# Patient Record
Sex: Male | Born: 1990 | Race: Black or African American | Hispanic: No | Marital: Single | Smoking: Current every day smoker
Health system: Southern US, Community
[De-identification: ages and names within clinical notes are randomized; demographics above are authoritative.]

## PROBLEM LIST (undated history)

## (undated) DIAGNOSIS — M109 Gout, unspecified: Secondary | ICD-10-CM

## (undated) DIAGNOSIS — E119 Type 2 diabetes mellitus without complications: Secondary | ICD-10-CM

## (undated) DIAGNOSIS — I10 Essential (primary) hypertension: Secondary | ICD-10-CM

## (undated) HISTORY — PX: TOTAL HIP ARTHROPLASTY: SHX124

---

## 2013-04-22 ENCOUNTER — Encounter (HOSPITAL_COMMUNITY): Payer: Self-pay | Admitting: Emergency Medicine

## 2013-04-22 ENCOUNTER — Emergency Department (HOSPITAL_COMMUNITY)
Admission: EM | Admit: 2013-04-22 | Discharge: 2013-04-22 | Disposition: A | Discharge status: Home / Self Care | Payer: Medicaid - Out of State | Attending: Emergency Medicine | Admitting: Emergency Medicine

## 2013-04-22 ENCOUNTER — Emergency Department (HOSPITAL_COMMUNITY): Payer: Medicaid - Out of State

## 2013-04-22 DIAGNOSIS — Z8639 Personal history of other endocrine, nutritional and metabolic disease: Secondary | ICD-10-CM | POA: Insufficient documentation

## 2013-04-22 DIAGNOSIS — Z791 Long term (current) use of non-steroidal anti-inflammatories (NSAID): Secondary | ICD-10-CM | POA: Insufficient documentation

## 2013-04-22 DIAGNOSIS — Y929 Unspecified place or not applicable: Secondary | ICD-10-CM | POA: Insufficient documentation

## 2013-04-22 DIAGNOSIS — S8990XA Unspecified injury of unspecified lower leg, initial encounter: Secondary | ICD-10-CM | POA: Insufficient documentation

## 2013-04-22 DIAGNOSIS — Y9301 Activity, walking, marching and hiking: Secondary | ICD-10-CM | POA: Insufficient documentation

## 2013-04-22 DIAGNOSIS — X500XXA Overexertion from strenuous movement or load, initial encounter: Secondary | ICD-10-CM | POA: Insufficient documentation

## 2013-04-22 DIAGNOSIS — Z862 Personal history of diseases of the blood and blood-forming organs and certain disorders involving the immune mechanism: Secondary | ICD-10-CM | POA: Insufficient documentation

## 2013-04-22 DIAGNOSIS — M722 Plantar fascial fibromatosis: Secondary | ICD-10-CM | POA: Insufficient documentation

## 2013-04-22 DIAGNOSIS — F17200 Nicotine dependence, unspecified, uncomplicated: Secondary | ICD-10-CM | POA: Insufficient documentation

## 2013-04-22 HISTORY — DX: Gout, unspecified: M10.9

## 2013-04-22 MED ORDER — MELOXICAM 15 MG PO TABS: 15.0000 mg | ORAL_TABLET | Freq: Every day | ORAL | Status: DC

## 2013-04-22 NOTE — ED Notes (Addendum)
Onset 04-20-13 walking outside and twisted right foot.

## 2013-04-22 NOTE — ED Provider Notes (Signed)
CSN: [id number]     Arrival date & time 04/22/13  1226 History  This chart was scribed for Renne Crigler, PA working with Shelda Jakes, MD by Quintella Reichert, ED Scribe. This patient was seen in room TR06C/TR06C and the patient's care was started at 12:56 PM.   Chief Complaint  Patient presents with  . Foot Pain    The history is provided by the patient. No language interpreter was used.    HPI Comments: Kevin Mccarty is a 22 y.o. male who presents to the Emergency Department complaining of a right foot injury.  Pt states that 2 nights ago he stepped down too hard onto a step.  Since then he has had constant moderate pain to the bottom of the foot.  He has been able to walk but with limited weight-bearing.  Pain is worse at night when lying down.  He has not taken any pain medication pta.  Pt notes that he has pins in both of his hips because "my bones weren't developed right." Patient does report ongoing foot pain to a lesser extent that is worse with first steps in morning with improvement with activity.    Past Medical History  Diagnosis Date  . Gout     History reviewed. No pertinent past surgical history.  History reviewed. No pertinent family history.   History  Substance Use Topics  . Smoking status: Current Every Day Smoker -- 0.50 packs/day for 5 years    Types: Cigarettes  . Smokeless tobacco: Not on file  . Alcohol Use: No     Review of Systems  Constitutional: Negative for activity change.  Musculoskeletal: Positive for myalgias. Negative for arthralgias, back pain, gait problem, joint swelling and neck pain.  Skin: Negative for wound.  Neurological: Negative for weakness and numbness.     Allergies  Tramadol  Home Medications   Current Outpatient Rx  Name  Route  Sig  Dispense  Refill  . meloxicam (MOBIC) 15 MG tablet   Oral   Take 1 tablet (15 mg total) by mouth daily.   14 tablet   0     BP 132/78  Pulse 80  Temp(Src) 97 F (36.1 C)  (Oral)  Resp 19  Wt 395 lb (179.171 kg)  SpO2 95%  Physical Exam  Nursing note and vitals reviewed. Constitutional: He appears well-developed and well-nourished. No distress.  HENT:  Head: Normocephalic and atraumatic.  Eyes: Conjunctivae and EOM are normal.  Neck: Normal range of motion. Neck supple. No tracheal deviation present.  Cardiovascular: Normal rate and normal pulses.   Pulmonary/Chest: Effort normal. No respiratory distress.  Musculoskeletal: Normal range of motion. He exhibits tenderness. He exhibits no edema.  Generally tender over sole of right foot  Neurological: He is alert. No sensory deficit.  Motor, sensation, and vascular distal to the injury is fully intact.   Skin: Skin is warm and dry.  Psychiatric: He has a normal mood and affect. His behavior is normal.    ED Course  Procedures (including critical care time)  DIAGNOSTIC STUDIES: Oxygen Saturation is 95% on room air, adequate by my interpretation.    COORDINATION OF CARE: 1:00 PM-Discussed treatment plan which includes x-ray with pt at bedside and pt agreed to plan.    Labs Review Labs Reviewed - No data to display  Imaging Review Dg Foot Complete Right  04/22/2013   CLINICAL DATA:  Pain.  EXAM: RIGHT FOOT COMPLETE - 3+ VIEW  COMPARISON:  None.  FINDINGS:  There is no evidence of fracture or dislocation. There is no evidence of arthropathy or other focal bone abnormality. Soft tissues are unremarkable.  IMPRESSION: Negative.   Electronically Signed   By: Maisie Fus  Register   On: 04/22/2013 13:37    EKG Interpretation   None      Vital signs reviewed and are as follows: Filed Vitals:   04/22/13 1235  BP: 132/78  Pulse: 80  Temp: 97 F (36.1 C)  Resp: 19   Patient informed of x-ray results. Will give prescription NSAID for pain and inflammation. Encouraged and counseled patient on gentle stretching of sole of foot. Podiatry referral given if not improved.   MDM   1. Plantar fasciitis of  right foot    X-ray negative. Patient with the pain on the sole of his foot. I suspect that he has an element of plantar fasciitis to the injury and also from being overweight. Lower extremity is neurovascularly intact.    I personally performed the services described in this documentation, which was scribed in my presence. The recorded information has been reviewed and is accurate.    Renne Crigler, PA-C 04/22/13 [phone]

## 2013-04-26 NOTE — ED Provider Notes (Signed)
Medical screening examination/treatment/procedure(s) were performed by non-physician practitioner and as supervising physician I was immediately available for consultation/collaboration.  EKG Interpretation   None         Preciosa Bundrick W. Kutler Vanvranken, MD 04/26/13 1326 

## 2019-08-25 ENCOUNTER — Encounter (HOSPITAL_COMMUNITY): Payer: Self-pay | Admitting: Emergency Medicine

## 2019-08-25 ENCOUNTER — Other Ambulatory Visit: Payer: Self-pay

## 2019-08-25 ENCOUNTER — Ambulatory Visit (HOSPITAL_COMMUNITY)
Admission: EM | Admit: 2019-08-25 | Discharge: 2019-08-25 | Disposition: A | Discharge status: Home / Self Care | Payer: Medicare Other | Attending: Family Medicine | Admitting: Family Medicine

## 2019-08-25 DIAGNOSIS — I10 Essential (primary) hypertension: Secondary | ICD-10-CM

## 2019-08-25 DIAGNOSIS — B370 Candidal stomatitis: Secondary | ICD-10-CM

## 2019-08-25 DIAGNOSIS — E109 Type 1 diabetes mellitus without complications: Secondary | ICD-10-CM

## 2019-08-25 HISTORY — DX: Essential (primary) hypertension: I10

## 2019-08-25 HISTORY — DX: Type 2 diabetes mellitus without complications: E11.9

## 2019-08-25 LAB — CBG MONITORING, ED: Glucose-Capillary: 363 mg/dL — ABNORMAL HIGH (ref 70–99)

## 2019-08-25 LAB — GLUCOSE, CAPILLARY: Glucose-Capillary: 363 mg/dL — ABNORMAL HIGH (ref 70–99)

## 2019-08-25 MED ORDER — BLOOD GLUCOSE MONITOR KIT: PACK | 0 refills | Status: AC

## 2019-08-25 MED ORDER — FLUCONAZOLE 150 MG PO TABS: 150.0000 mg | ORAL_TABLET | Freq: Every morning | ORAL | 0 refills | Status: DC

## 2019-08-25 NOTE — ED Triage Notes (Signed)
Pt sts he ran out of insulin last week and just picked it up 2 days ago to restart; pt sts noticed a yeast infection and dry mouth last week; pt sts CBG was over 400 at home

## 2019-08-25 NOTE — ED Provider Notes (Signed)
[facility]    CSN: [id number] Arrival date & time: 08/25/19  1205      History   Chief Complaint Chief Complaint  Patient presents with  . Hyperglycemia    HPI Kevin Mccarty is a 29 y.o. male.   Initial [facility] visit with this 29 yo man complaining about possible hyperglycemia.  He is back on his insulin but he has a yeast infection on his penis and in his mouth.  He has mild blurry vision in his left eye but is getting better.  He does have polyuria still.  Pt sts he ran out of insulin last week and just picked it up 2 days ago to restart; pt sts noticed a yeast infection and dry mouth last week; pt sts CBG was over 400 at home        Past Medical History:  Diagnosis Date  . Diabetes mellitus without complication (HCC)   . Gout   . Hypertension     There are no problems to display for this patient.   History reviewed. No pertinent surgical history.     Home Medications    Prior to Admission medications   Medication Sig Start Date End Date Taking? Authorizing Provider  glipiZIDE (GLUCOTROL) 5 MG tablet Take 5 mg by mouth daily before breakfast.   Yes [provider]  insulin glargine (LANTUS) 100 UNIT/ML injection Inject 70 Units into the skin at bedtime.   Yes [provider]  losartan-hydrochlorothiazide (HYZAAR) 100-12.5 MG tablet Take 1 tablet by mouth daily.   Yes [provider]  fluconazole (DIFLUCAN) 150 MG tablet Take 1 tablet (150 mg total) by mouth every morning. 08/25/19   Elvina Sidle, MD  meloxicam (MOBIC) 15 MG tablet Take 1 tablet (15 mg total) by mouth daily. 04/22/13   Renne Crigler, PA-C    Family History History reviewed. No pertinent family history.  Social History Social History   Tobacco Use  . Smoking status: Current Every Day Smoker    Packs/day: 0.50    Years: 5.00    Pack years: 2.50    Types: Cigarettes  Substance Use Topics  . Alcohol use: No  . Drug use: No     Allergies     Tramadol   Review of Systems Review of Systems  Eyes: Positive for visual disturbance.  Genitourinary: Positive for frequency and penile pain.  All other systems reviewed and are negative.    Physical Exam Triage Vital Signs ED Triage Vitals  Enc Vitals Group     BP      Pulse      Resp      Temp      Temp src      SpO2      Weight      Height      Head Circumference      Peak Flow      Pain Score      Pain Loc      Pain Edu?      Excl. in GC?    No data found.  Updated Vital Signs BP (!) 138/113 (BP Location: Left Wrist)   Pulse 81   Temp 98.2 F (36.8 C) (Oral)   Resp 18   Wt (!) 186.9 kg   SpO2 97%    Physical Exam Vitals and nursing note reviewed.  Constitutional:      Appearance: Normal appearance. He is obese.  HENT:     Mouth/Throat:     Pharynx:  Oropharyngeal exudate present.  Cardiovascular:     Pulses: Normal pulses.  Pulmonary:     Effort: Pulmonary effort is normal.  Musculoskeletal:        General: Normal range of motion.     Cervical back: Normal range of motion and neck supple.  Skin:    General: Skin is dry.  Neurological:     General: No focal deficit present.     Mental Status: He is alert and oriented to person, place, and time.  Psychiatric:        Mood and Affect: Mood normal.      UC Treatments / Results  Labs (all labs ordered are listed, but only abnormal results are displayed) Labs Reviewed  GLUCOSE, CAPILLARY - Abnormal; Notable for the following components:      Result Value   Glucose-Capillary 363 (*)    All other components within normal limits  CBG MONITORING, ED - Abnormal; Notable for the following components:   Glucose-Capillary 363 (*)    All other components within normal limits  CBG MONITORING, ED    EKG   Radiology No results found.  Procedures Procedures (including critical care time)  Medications Ordered in UC Medications - No data to display  Initial Impression / Assessment and Plan  / UC Course  I have reviewed the triage vital signs and the nursing notes.  Pertinent labs & imaging results that were available during my care of the patient were reviewed by me and considered in my medical decision making (see chart for details).    Final Clinical Impressions(s) / UC Diagnoses   Final diagnoses:  Insulin dependent diabetes mellitus type IA [facility])  Essential hypertension  Oral thrush   Discharge Instructions   None    ED Prescriptions    Medication Sig Dispense Auth. Provider   fluconazole (DIFLUCAN) 150 MG tablet Take 1 tablet (150 mg total) by mouth every morning. 7 tablet Robyn Haber, MD     I have reviewed the PDMP during this encounter.   Robyn Haber, MD 08/25/19 1243

## 2019-08-25 NOTE — ED Notes (Addendum)
Attempted to weigh patient upon arrival.  Weight exceeded scales weight limit.  Patient states last known weight is 412 lbs.

## 2019-08-28 ENCOUNTER — Ambulatory Visit (INDEPENDENT_AMBULATORY_CARE_PROVIDER_SITE_OTHER)
Admission: EM | Admit: 2019-08-28 | Discharge: 2019-08-28 | Disposition: A | Discharge status: Home / Self Care | Payer: Medicare Other | Source: Home / Self Care | Attending: Internal Medicine | Admitting: Internal Medicine

## 2019-08-28 ENCOUNTER — Emergency Department (HOSPITAL_COMMUNITY): Payer: Medicare Other

## 2019-08-28 ENCOUNTER — Emergency Department (HOSPITAL_COMMUNITY)
Admission: EM | Admit: 2019-08-28 | Discharge: 2019-08-28 | Disposition: A | Discharge status: Home / Self Care | Payer: Medicare Other | Attending: Internal Medicine | Admitting: Internal Medicine

## 2019-08-28 ENCOUNTER — Encounter (HOSPITAL_COMMUNITY): Payer: Self-pay

## 2019-08-28 ENCOUNTER — Other Ambulatory Visit: Payer: Self-pay

## 2019-08-28 ENCOUNTER — Encounter (HOSPITAL_COMMUNITY): Payer: Self-pay | Admitting: Radiology

## 2019-08-28 DIAGNOSIS — F17210 Nicotine dependence, cigarettes, uncomplicated: Secondary | ICD-10-CM | POA: Diagnosis not present

## 2019-08-28 DIAGNOSIS — N492 Inflammatory disorders of scrotum: Secondary | ICD-10-CM

## 2019-08-28 DIAGNOSIS — N5082 Scrotal pain: Secondary | ICD-10-CM | POA: Diagnosis present

## 2019-08-28 DIAGNOSIS — E119 Type 2 diabetes mellitus without complications: Secondary | ICD-10-CM | POA: Insufficient documentation

## 2019-08-28 DIAGNOSIS — I10 Essential (primary) hypertension: Secondary | ICD-10-CM | POA: Insufficient documentation

## 2019-08-28 DIAGNOSIS — Z794 Long term (current) use of insulin: Secondary | ICD-10-CM | POA: Diagnosis not present

## 2019-08-28 LAB — BASIC METABOLIC PANEL
Anion gap: 11 (ref 5–15)
BUN: 5 mg/dL — ABNORMAL LOW (ref 6–20)
CO2: 26 mmol/L (ref 22–32)
Calcium: 9.6 mg/dL (ref 8.9–10.3)
Chloride: 100 mmol/L (ref 98–111)
Creatinine, Ser: 0.7 mg/dL (ref 0.61–1.24)
GFR calc Af Amer: >60 mL/min (ref >60)
GFR calc non Af Amer: >60 mL/min (ref >60)
Glucose, Bld: 254 mg/dL — ABNORMAL HIGH (ref 70–99)
Potassium: 3.9 mmol/L (ref 3.5–5.1)
Sodium: 137 mmol/L (ref 135–145)

## 2019-08-28 LAB — CBC WITH DIFFERENTIAL/PLATELET
Abs Immature Granulocytes: 0.02 10*3/uL (ref 0.00–0.07)
Basophils Absolute: 0 10*3/uL (ref 0.0–0.1)
Basophils Relative: 0 %
Eosinophils Absolute: 0.2 10*3/uL (ref 0.0–0.5)
Eosinophils Relative: 3 %
HCT: 44 % (ref 39.0–52.0)
Hemoglobin: 14.8 g/dL (ref 13.0–17.0)
Immature Granulocytes: 0 %
Lymphocytes Relative: 31 %
Lymphs Abs: 2.5 10*3/uL (ref 0.7–4.0)
MCH: 27.4 pg (ref 26.0–34.0)
MCHC: 33.6 g/dL (ref 30.0–36.0)
MCV: 81.3 fL (ref 80.0–100.0)
Monocytes Absolute: 0.7 10*3/uL (ref 0.1–1.0)
Monocytes Relative: 8 %
Neutro Abs: 4.8 10*3/uL (ref 1.7–7.7)
Neutrophils Relative %: 58 %
Platelets: 204 10*3/uL (ref 150–400)
RBC: 5.41 MIL/uL (ref 4.22–5.81)
RDW: 13.2 % (ref 11.5–15.5)
WBC: 8.3 10*3/uL (ref 4.0–10.5)
nRBC: 0 % (ref 0.0–0.2)

## 2019-08-28 LAB — CBG MONITORING, ED: Glucose-Capillary: 245 mg/dL — ABNORMAL HIGH (ref 70–99)

## 2019-08-28 MED ORDER — SODIUM CHLORIDE 0.9 % IV BOLUS
500.0000 mL | Freq: Once | INTRAVENOUS | Status: AC
  Administered 2019-08-28: 17:00:00 500 mL via INTRAVENOUS

## 2019-08-28 MED ORDER — MORPHINE SULFATE (PF) 2 MG/ML IV SOLN
2.0000 mg | Freq: Once | INTRAVENOUS | Status: AC
  Administered 2019-08-28: 17:00:00 2 mg via INTRAVENOUS
  Filled 2019-08-28: qty 1

## 2019-08-28 MED ORDER — VANCOMYCIN HCL 2000 MG/400ML IV SOLN
2000.0000 mg | Freq: Three times a day (TID) | INTRAVENOUS | Status: DC
  Administered 2019-08-28: 19:00:00 2000 mg via INTRAVENOUS
  Filled 2019-08-28 (×2): qty 400

## 2019-08-28 MED ORDER — IOHEXOL 300 MG/ML  SOLN
125.0000 mL | Freq: Once | INTRAMUSCULAR | Status: AC | PRN
  Administered 2019-08-28: 19:00:00 125 mL via INTRAVENOUS

## 2019-08-28 MED ORDER — SULFAMETHOXAZOLE-TRIMETHOPRIM 800-160 MG PO TABS: 1.0000 | ORAL_TABLET | Freq: Two times a day (BID) | ORAL | 0 refills | Status: AC

## 2019-08-28 NOTE — ED Provider Notes (Addendum)
[facility] EMERGENCY DEPARTMENT Provider Note   CSN: [id number] Arrival date & time: 08/28/19  1142     History Chief Complaint  Patient presents with  . Abscess    Corbett Moulder is a 29 y.o. male history of morbid obesity current weight 190.5 kg, type 2 diabetes, hypertension, gout.  Patient presents today for left scrotal pain onset 2-3 days ago, describes a mild burning sensation worsened with ambulation improved with rest, nonradiating, constant.  He reports he was seen at the urgent care prior to arrival who sent him for further evaluation and possible drainage.  He denies fever/chills, nausea/vomiting, dysuria/hematuria, penile discharge, concern for STI or any additional concerns today.    HPI     Past Medical History:  Diagnosis Date  . Diabetes mellitus without complication ([place])   . Gout   . Hypertension     There are no problems to display for this patient.   Past Surgical History:  Procedure Laterality Date  . TOTAL HIP ARTHROPLASTY Bilateral        Family History  Problem Relation Age of Onset  . Diabetes Father     Social History   Tobacco Use  . Smoking status: Current Every Day Smoker    Packs/day: 0.50    Years: 5.00    Pack years: 2.50    Types: Cigarettes  Substance Use Topics  . Alcohol use: No  . Drug use: No    Home Medications Prior to Admission medications   Medication Sig Start Date End Date Taking? Authorizing Provider  blood glucose meter kit and supplies KIT Dispense based on patient and insurance preference. Use up to four times daily as directed. (FOR ICD-9 250.00, 250.01). 08/25/19   Robyn Haber, MD  fluconazole (DIFLUCAN) 150 MG tablet Take 1 tablet (150 mg total) by mouth every morning. 08/25/19   Robyn Haber, MD  glipiZIDE (GLUCOTROL) 5 MG tablet Take 5 mg by mouth daily before breakfast.    [provider]  insulin glargine (LANTUS) 100 UNIT/ML injection Inject 70 Units into the skin  at bedtime.    [provider]  losartan-hydrochlorothiazide (HYZAAR) 100-12.5 MG tablet Take 1 tablet by mouth daily.    [provider]  sulfamethoxazole-trimethoprim (BACTRIM DS) 800-160 MG tablet Take 1 tablet by mouth 2 (two) times daily for 14 days. 08/28/19 09/11/19  Deliah Boston, PA-C    Allergies    Tramadol  Review of Systems   Review of Systems Ten systems are reviewed and are negative for acute change except as noted in the HPI  Physical Exam Updated Vital Signs BP 126/73   Pulse 74   Temp 98.6 F (37 C) (Oral)   Resp 19   Ht 6' 1" (1.854 m)   Wt (!) 190.5 kg   SpO2 95%   BMI 55.41 kg/m   Physical Exam Constitutional:      General: He is not in acute distress.    Appearance: Normal appearance. He is well-developed. He is not ill-appearing or diaphoretic.  HENT:     Head: Normocephalic and atraumatic.     Right Ear: External ear normal.     Left Ear: External ear normal.     Nose: Nose normal.  Eyes:     General: Vision grossly intact. Gaze aligned appropriately.     Pupils: Pupils are equal, round, and reactive to light.  Neck:     Trachea: Trachea and phonation normal. No tracheal deviation.  Pulmonary:  Effort: Pulmonary effort is normal. No respiratory distress.  Abdominal:     General: There is no distension.     Palpations: Abdomen is soft.     Tenderness: There is no abdominal tenderness. There is no guarding or rebound.  Genitourinary:    Comments: Chaperone present during genital exam Trey NT.  Erythema of the left scrotum, appears to extend down towards the perineum.  Small area of fluctuance present.  No other external genital lesions noted, no bumps on head of penis, specifically no vesicles concerning for herpes or chancre suggestive of syphilis.  No tenderness around glands, limited evaluation due to body habitus. No tenderness with palpation of the testicles.  Musculoskeletal:        General: Normal range of  motion.     Cervical back: Normal range of motion.  Skin:    General: Skin is warm and dry.  Neurological:     Mental Status: He is alert.     GCS: GCS eye subscore is 4. GCS verbal subscore is 5. GCS motor subscore is 6.     Comments: Speech is clear and goal oriented, follows commands Major Cranial nerves without deficit, no facial droop Moves extremities without ataxia, coordination intact  Psychiatric:        Behavior: Behavior normal.     ED Results / Procedures / Treatments   Labs (all labs ordered are listed, but only abnormal results are displayed) Labs Reviewed  BASIC METABOLIC PANEL - Abnormal; Notable for the following components:      Result Value   Glucose, Bld 254 (*)    BUN 5 (*)    All other components within normal limits  CBG MONITORING, ED - Abnormal; Notable for the following components:   Glucose-Capillary 245 (*)    All other components within normal limits  CULTURE, BLOOD (ROUTINE X 2)  CULTURE, BLOOD (ROUTINE X 2)  CBC WITH DIFFERENTIAL/PLATELET    EKG None  Radiology CT PELVIS W CONTRAST  Result Date: 08/28/2019 CLINICAL DATA:  Possible and rectal abscess 4 days. Scrotal abscess. EXAM: CT PELVIS WITH CONTRAST TECHNIQUE: Multidetector CT imaging of the pelvis was performed using the standard protocol following the bolus administration of intravenous contrast. CONTRAST:  174m OMNIPAQUE IOHEXOL 300 MG/ML  SOLN COMPARISON:  None. FINDINGS: Urinary Tract:  No abnormality visualized. Bowel: Unremarkable visualized pelvic bowel loops. No evidence of anorectal abscess. Vascular/Lymphatic: No pathologically enlarged lymph nodes. No significant vascular abnormality seen. Reproductive: Prostate gland is normal. Visualized penis and testicles are within normal. There is a slightly rim enhancing oval fluid collection immediately below the skin at the junction of the left posterior scrotum to perineum as this measures approximately 2.2 x 2.8 x 2.7 cm in transverse,  AP and craniocaudal dimensions. There is mild adjacent stranding of the adjacent fat and minimal thickening of the adjacent skin as findings are likely due to a small subcutaneous abscess. Other:  None. Musculoskeletal: Mild degenerative change of the hips with screws bridging the femoral neck into the femoral head bilaterally and intact. IMPRESSION: Slight rim enhancing oval fluid collection abutting the skin in the subcutaneous tissues at the junction of the left posterior scrotum to perineum measuring 2.2 x 2.8 x 2.7 cm. Mild adjacent inflammatory change. This likely represents a small subcutaneous abscess. No evidence of anorectal infection. Electronically Signed   By: DMarin OlpM.D.   On: 08/28/2019 18:59    Procedures Procedures (including critical care time)  Medications Ordered in ED Medications  vancomycin (  VANCOREADY) IVPB 2000 mg/400 mL (2,000 mg Intravenous New Bag/Given 08/28/19 1903)  sodium chloride 0.9 % bolus 500 mL (500 mLs Intravenous New Bag/Given 08/28/19 1647)  morphine 2 MG/ML injection 2 mg (2 mg Intravenous Given 08/28/19 1647)  iohexol (OMNIPAQUE) 300 MG/ML solution 125 mL (125 mLs Intravenous Contrast Given 08/28/19 1832)    ED Course  I have reviewed the triage vital signs and the nursing notes.  Pertinent labs & imaging results that were available during my care of the patient were reviewed by me and considered in my medical decision making (see chart for details).  Clinical Course as of Aug 27 2017  Thu Aug 28, 2019  1916 Dr. Alyson Ingles; Under 3cm can manage with abx and outpatient f/u; recommends bactrim x 14 days   [BM]    Clinical Course User Index [BM] Gari Crown   MDM Rules/Calculators/A&P                     29 year old morbidly obese male with history of type 2 diabetes presented with pain of the left scrotum with a small abscess and mild surrounding cellulitis.  No systemic symptoms.  He has no other concerns.  Vital signs stable, does  not appear toxic/septic.  Attempted to review urgent care note however it is incomplete.  Concerned due to patient's body habitus and history of diabetes as well as location of the abscess for possible deep space infections, lower suspicion for Fournier's however that is also of concern.  Will obtain basic blood work and a CT scan of the pelvis for assessment of depth and extent.  Hesitant to perform I&D at this time due to location of abscess additionally concerned patient will not be able to care for the area after drainage due to body habitus. - CBC within normal limits no evidence of anemia or leukocytosis to suggest infection.  BMP showed elevation of glucose, no evidence of DKA.  CBG 245.  Patient was treated with IV fluid bolus, pain medication and started on vancomycin empirically. - CT pelvis:  IMPRESSION:  Slight rim enhancing oval fluid collection abutting the skin in the  subcutaneous tissues at the junction of the left posterior scrotum  to perineum measuring 2.2 x 2.8 x 2.7 cm. Mild adjacent inflammatory  change. This likely represents a small subcutaneous abscess. No  evidence of anorectal infection.  - 7:16 PM: Discussed the case with urologist Dr. Alyson Ingles.  He advises that we could offer patient needle aspiration today however as the abscess is under 3 cm it does not necessitate incision and drainage at this time.  He recommends that either way patient may be discharged at this time on Bactrim twice daily x14 days and to follow-up in his office next week. - Patient reevaluated resting comfortably no acute distress requesting food.  I discussed risks versus benefits of needle aspiration of patient's scrotal wall abscess today, he refused.  He wishes to return home at this time and use warm soaks and the antibiotic.  Feel this is a reasonable plan of care at this time.  No evidence of Fournier's or other emergent pathologies at this time.  Will follow urologist recommendations, patient  aware to call urologist office tomorrow morning to schedule follow-up appointment.  Return precautions discussed at length with patient and he stated understanding.  At this time there does not appear to be any evidence of an acute emergency medical condition and the patient appears stable for discharge with appropriate outpatient  follow up. Diagnosis was discussed with patient who verbalizes understanding of care plan and is agreeable to discharge. I have discussed return precautions with patient who verbalizes understanding of return precautions. Patient encouraged to follow-up with their PCP and urology. All questions answered.  Discussed case with Dr. Rogene Houston who agrees with urology plan.  Note: Portions of this report may have been transcribed using voice recognition software. Every effort was made to ensure accuracy; however, inadvertent computerized transcription errors may still be present. Final Clinical Impression(s) / ED Diagnoses Final diagnoses:  Abscess of scrotal wall    Rx / DC Orders ED Discharge Orders         Ordered    sulfamethoxazole-trimethoprim (BACTRIM DS) 800-160 MG tablet  2 times daily     08/28/19 2008           Gari Crown 08/28/19 2020    Fredia Sorrow, MD 09/10/19 [phone]

## 2019-08-28 NOTE — ED Provider Notes (Signed)
[place]    CSN: [id number] Arrival date & time: 08/28/19  1009      History   Chief Complaint Chief Complaint  Patient presents with  . abcess in left groin    HPI Kevin Mccarty is a 29 y.o. male with history of diabetes mellitus type 2 comes to urgent care with complaints of painful swelling in the left scrotum of a few days duration.  Patient works seen here few days ago for diabetes mellitus type 2.  Patient said following that he started having painful swelling in the left scrotal area.  No fever or chills.  He denies knowledge of any erythema.  He feels that there is an abscess in the scrotal region which needs to be drained.  Patient came to the urgent care on account of painful swelling in the scrotal region.Marland Kitchen   HPI  Past Medical History:  Diagnosis Date  . Diabetes mellitus without complication ([place])   . Gout   . Hypertension     There are no problems to display for this patient.   Past Surgical History:  Procedure Laterality Date  . TOTAL HIP ARTHROPLASTY Bilateral        Home Medications    Prior to Admission medications   Medication Sig Start Date End Date Taking? Authorizing Provider  blood glucose meter kit and supplies KIT Dispense based on patient and insurance preference. Use up to four times daily as directed. (FOR ICD-9 250.00, 250.01). 08/25/19   Robyn Haber, MD  fluconazole (DIFLUCAN) 150 MG tablet Take 1 tablet (150 mg total) by mouth every morning. 08/25/19   Robyn Haber, MD  glipiZIDE (GLUCOTROL) 5 MG tablet Take 5 mg by mouth daily before breakfast.    [provider]  insulin glargine (LANTUS) 100 UNIT/ML injection Inject 70 Units into the skin at bedtime.    [provider]  losartan-hydrochlorothiazide (HYZAAR) 100-12.5 MG tablet Take 1 tablet by mouth daily.    [provider]    Family History Family History  Problem Relation Age of Onset  . Diabetes Father     Social History Social  History   Tobacco Use  . Smoking status: Current Every Day Smoker    Packs/day: 0.50    Years: 5.00    Pack years: 2.50    Types: Cigarettes  Substance Use Topics  . Alcohol use: No  . Drug use: No     Allergies   Tramadol   Review of Systems Review of Systems  Constitutional: Negative for activity change, fatigue and fever.  Respiratory: Negative.   Gastrointestinal: Negative for abdominal pain, nausea and vomiting.  Genitourinary: Positive for genital sores and scrotal swelling. Negative for discharge, dysuria, hematuria, penile pain and penile swelling.  Musculoskeletal: Negative.      Physical Exam Triage Vital Signs ED Triage Vitals [08/28/19 1048]  Enc Vitals Group     BP (!) 162/96     Pulse Rate (!) 104     Resp 18     Temp 98.3 F (36.8 C)     Temp Source Oral     SpO2 96 %     Weight (!) 420 lb (190.5 kg)     Height _0  (1.854 m)     Head Circumference      Peak Flow      Pain Score 8     Pain Loc      Pain Edu?      Excl. in [place]?  No data found.  Updated Vital Signs BP (!) 162/96   Pulse (!) 104   Temp 98.3 F (36.8 C) (Oral)   Resp 18   Ht _0  (1.854 m)   Wt (!) 190.5 kg   SpO2 96%   BMI 55.41 kg/m   Visual Acuity Right Eye Distance:   Left Eye Distance:   Bilateral Distance:    Right Eye Near:   Left Eye Near:    Bilateral Near:     Physical Exam Vitals and nursing note reviewed.  Constitutional:      General: He is in acute distress.     Appearance: He is not ill-appearing.  Cardiovascular:     Rate and Rhythm: Normal rate and regular rhythm.  Abdominal:     General: Bowel sounds are normal.     Palpations: Abdomen is soft.  Genitourinary:    Penis: Normal.      Comments: Tender scrotal swelling.  No discharge.  Difficult to evaluate for fluctuance because of tenderness. Musculoskeletal:        General: No swelling or signs of injury. Normal range of motion.     Right lower leg: No edema.  Skin:    Capillary  Refill: Capillary refill takes less than 2 seconds.  Neurological:     General: No focal deficit present.     Mental Status: He is alert and oriented to person, place, and time.      UC Treatments / Results  Labs (all labs ordered are listed, but only abnormal results are displayed) Labs Reviewed - No data to display  EKG   Radiology No results found.  Procedures Procedures (including critical care time)  Medications Ordered in UC Medications - No data to display  Initial Impression / Assessment and Plan / UC Course  I have reviewed the triage vital signs and the nursing notes.  Pertinent labs & imaging results that were available during my care of the patient were reviewed by me and considered in my medical decision making (see chart for details).    1.  Scrotal cellulitis with possible abscess: Given the location of the abscess patient was advised to go to the emergency department where this allowed more resources to evaluate, and drain scrotal abscess if indeed there is an abscess.  Agreed to go to the emergency department to be evaluated. Final Clinical Impressions(s) / UC Diagnoses   Final diagnoses:  Cellulitis, scrotum   Discharge Instructions   None    ED Prescriptions    None     PDMP not reviewed this encounter.   Chase Picket, MD 08/28/19 1725

## 2019-08-28 NOTE — Discharge Instructions (Addendum)
You have been diagnosed today with Left Scrotal Wall abscess.  At this time there does not appear to be the presence of an emergent medical condition, however there is always the potential for conditions to change. Please read and follow the below instructions.  Please return to the Emergency Department immediately for any new or worsening symptoms or if your symptoms do not improve within 2 days. Please be sure to follow up with your Primary Care Provider within one week regarding your visit today; please call their office to schedule an appointment even if you are feeling better for a follow-up visit. Please take the antibiotic Bactrim as prescribed for treatment of your infection.  Please call the urologist Dr. Ronne Binning tomorrow morning to schedule a follow-up appointment for reevaluation of your infection.  Get help right away if: You have very bad (severe) pain. You see red streaks on your skin spreading away from the abscess. You have fever or chills You have nausea or vomiting You have abdominal pain You have pain with bowel movments You have any new/concerning or worsening of symptoms  Please read the additional information packets attached to your discharge summary.  Do not take your medicine if  develop an itchy rash, swelling in your mouth or lips, or difficulty breathing; call 911 and seek immediate emergency medical attention if this occurs.  Note: Portions of this text may have been transcribed using voice recognition software. Every effort was made to ensure accuracy; however, inadvertent computerized transcription errors may still be present.

## 2019-08-28 NOTE — ED Triage Notes (Signed)
Pt has abscess on L groin x 4 days. On Fluconazole for yeast infection and thrush-states abscess surfaced after he started taking abx. Sent here from Urgent Care for I/D.

## 2019-08-28 NOTE — Progress Notes (Signed)
Pharmacy Antibiotic Note  Kevin Mccarty is a 29 y.o. male admitted on 08/28/2019 with cellulitis. Pharmacy has been consulted for vancomycin dosing.  Pt has abscess on L groin x 4 days. On Fluconazole for yeast infection and thrush-states abscess surfaced after he started taking abx. Unclear what antibiotic he was taking at this time.   Plan: Vancomycin 2000mg  IV Q8h Goal AUC 400-550 Expected AUC: 477 SCr used: 0.8 F/u clinical progress, c/s, de-escalation, and LOT   Height: 6\' 1"  (185.4 cm) Weight: (!) 420 lb (190.5 kg) IBW/kg (Calculated) : 79.9  Temp (24hrs), Avg:98.5 F (36.9 C), Min:98.3 F (36.8 C), Max:98.6 F (37 C)  Recent Labs  Lab 08/28/19 1611  CREATININE 0.70    Estimated Creatinine Clearance: 239.2 mL/min (by C-G formula based on SCr of 0.7 mg/dL).    Allergies  Allergen Reactions  . Tramadol Nausea And Vomiting    Antimicrobials this admission: 3/25 vanc >>   Dose adjustments this admission: N/A  Microbiology results: 3/25 BCx: sent  Thank you for allowing pharmacy to be a part of this patient's care.  4/25, PharmD PGY1 Ambulatory Care Pharmacy Resident 08/28/2019 6:12 PM

## 2019-08-28 NOTE — ED Triage Notes (Signed)
Pt states he noticed he has an abcess on his left testicle 5 days ago. It's a small abcess, smaller than a dime. Pt states 8/10 throbbing pain from abcess.

## 2019-09-02 LAB — CULTURE, BLOOD (ROUTINE X 2)
Culture: NO GROWTH
Culture: NO GROWTH
Special Requests: ADEQUATE

## 2019-09-16 ENCOUNTER — Ambulatory Visit (HOSPITAL_COMMUNITY)
Admission: EM | Admit: 2019-09-16 | Discharge: 2019-09-16 | Disposition: A | Discharge status: Home / Self Care | Payer: Medicare Other | Attending: Family Medicine | Admitting: Family Medicine

## 2019-09-16 ENCOUNTER — Other Ambulatory Visit: Payer: Self-pay

## 2019-09-16 DIAGNOSIS — L0291 Cutaneous abscess, unspecified: Secondary | ICD-10-CM

## 2019-09-16 MED ORDER — HYDROCODONE-ACETAMINOPHEN 7.5-325 MG PO TABS: 1.0000 | ORAL_TABLET | Freq: Four times a day (QID) | ORAL | 0 refills | Status: DC | PRN

## 2019-09-16 MED ORDER — CLINDAMYCIN HCL 300 MG PO CAPS: 300.0000 mg | ORAL_CAPSULE | Freq: Three times a day (TID) | ORAL | 0 refills | Status: DC

## 2019-09-16 NOTE — Discharge Instructions (Addendum)
Take the antibiotic as directed Take pain medicine if needed Do not drive on the pain medicine Go to family medicine to set up appointment for primary care follow up

## 2019-09-16 NOTE — ED Provider Notes (Signed)
Kevin Mccarty    CSN: [id number] Arrival date & time: 09/16/19  1411      History   Chief Complaint Chief Complaint  Patient presents with  . Abscess    HPI Kevin Mccarty is a 29 y.o. male.   HPI  Recurrent problem with abscesses Just treated for one in his groin and was on clindamycin 2 x a day after it improved he reduced antibiotic to one time a day, thought this would prevent Now has large abscess on his abdomen He is obese and diabetic, HTN Disabled from working Has had 2 total hip replacements   Past Medical History:  Diagnosis Date  . Diabetes mellitus without complication ([place])   . Gout   . Hypertension     There are no problems to display for this patient.   Past Surgical History:  Procedure Laterality Date  . TOTAL HIP ARTHROPLASTY Bilateral        Home Medications    Prior to Admission medications   Medication Sig Start Date End Date Taking? Authorizing Provider  glipiZIDE (GLUCOTROL) 5 MG tablet Take 5 mg by mouth daily before breakfast.   Yes [provider]  insulin glargine (LANTUS) 100 UNIT/ML injection Inject 70 Units into the skin at bedtime.   Yes [provider]  losartan-hydrochlorothiazide (HYZAAR) 100-12.5 MG tablet Take 1 tablet by mouth daily.   Yes [provider]  blood glucose meter kit and supplies KIT Dispense based on patient and insurance preference. Use up to four times daily as directed. (FOR ICD-9 250.00, 250.01). 08/25/19   Kevin Haber, MD  clindamycin (CLEOCIN) 300 MG capsule Take 1 capsule (300 mg total) by mouth 3 (three) times daily. 09/16/19   Kevin Everts, MD  HYDROcodone-acetaminophen ([place]) 7.5-325 MG tablet Take 1 tablet by mouth every 6 (six) hours as needed for moderate pain. 09/16/19   Kevin Everts, MD  LANTUS SOLOSTAR 100 UNIT/ML Solostar Pen  08/22/19   [provider]    Family History Family History  Problem Relation Age of Onset  . Diabetes Father      Social History Social History   Tobacco Use  . Smoking status: Current Every Day Smoker    Packs/day: 0.50    Years: 5.00    Pack years: 2.50    Types: Cigarettes  Substance Use Topics  . Alcohol use: No  . Drug use: No     Allergies   Tramadol   Review of Systems Review of Systems  Constitutional: Negative for chills and fever.  Skin: Positive for color change and wound.     Physical Exam Triage Vital Signs ED Triage Vitals  Enc Vitals Group     BP 09/16/19 1446 138/86     Pulse Rate 09/16/19 1446 99     Resp 09/16/19 1446 18     Temp 09/16/19 1446 98.6 F (37 C)     Temp Source 09/16/19 1446 Oral     SpO2 09/16/19 1446 97 %     Weight --      Height --      Head Circumference --      Peak Flow --      Pain Score 09/16/19 1452 10     Pain Loc --      Pain Edu? --      Excl. in [place]? --    No data found.  Updated Vital Signs BP 138/86 (BP Location: Left Arm)   Pulse 99  Temp 98.6 F (37 C) (Oral)   Resp 18   SpO2 97%     Physical Exam Constitutional:      General: He is not in acute distress.    Appearance: He is well-developed. He is obese.  HENT:     Head: Normocephalic and atraumatic.     Mouth/Throat:     Comments: mask Eyes:     Conjunctiva/sclera: Conjunctivae normal.     Pupils: Pupils are equal, round, and reactive to light.  Cardiovascular:     Rate and Rhythm: Normal rate.  Pulmonary:     Effort: Pulmonary effort is normal. No respiratory distress.  Musculoskeletal:        General: Normal range of motion.     Cervical back: Normal range of motion.  Skin:    General: Skin is warm and dry.       Neurological:     General: No focal deficit present.     Mental Status: He is alert.  Psychiatric:        Mood and Affect: Mood normal.        Behavior: Behavior normal.      UC Treatments / Results  Labs (all labs ordered are listed, but only abnormal results are displayed) Labs Reviewed  AEROBIC/ANAEROBIC CULTURE  (SURGICAL/DEEP WOUND)  AEROBIC CULTURE (SUPERFICIAL SPECIMEN)    EKG   Radiology No results found.  Procedures Incision and Drainage  Date/Time: 09/16/2019 3:46 PM Performed by: Kevin Everts, MD Authorized by: Kevin Everts, MD   Consent:    Consent obtained:  Verbal   Consent given by:  Patient   Risks discussed:  Bleeding and incomplete drainage Location:    Type:  Abscess Pre-procedure details:    Skin preparation:  Antiseptic wash Anesthesia (see MAR for exact dosages):    Anesthesia method:  Local infiltration   Local anesthetic:  Lidocaine 2% WITH epi Procedure type:    Complexity:  Simple Procedure details:    Incision types:  Stab incision   Incision depth:  Subcutaneous   Scalpel blade:  11   Wound management:  Probed and deloculated   Drainage:  Bloody and purulent   Drainage amount:  Moderate   Wound treatment:  Wound left open Post-procedure details:    Patient tolerance of procedure:  Tolerated well, no immediate complications   (including critical care time)  Medications Ordered in UC Medications - No data to display  Initial Impression / Assessment and Plan / UC Course  I have reviewed the triage vital signs and the nursing notes.  Pertinent labs & imaging results that were available during my care of the patient were reviewed by me and considered in my medical decision making (see chart for details).     Discussed wound care Finding PCP How to get test results on [facility] Final Clinical Impressions(s) / UC Diagnoses   Final diagnoses:  Abscess     Discharge Instructions     Take the antibiotic as directed Take pain medicine if needed Do not drive on the pain medicine Go to family medicine to set up appointment for primary care follow up   ED Prescriptions    Medication Sig Dispense Auth. Provider   clindamycin (CLEOCIN) 300 MG capsule Take 1 capsule (300 mg total) by mouth 3 (three) times daily. 30 capsule Kevin Everts, MD   HYDROcodone-acetaminophen [facility]) 7.5-325 MG tablet Take 1 tablet by mouth every 6 (six) hours as needed for moderate pain. 10 tablet Kevin Mccarty,  Kevin Banker, MD     I have reviewed the PDMP during this encounter.   Kevin Everts, MD 09/16/19 [phone]

## 2019-09-16 NOTE — ED Triage Notes (Signed)
Pt c/o abscess to left abdom/flank area for approx 3 days. Denies drainage from area or n/v/d, fever, chills. Abscess appearing area approx 3 cm long noted with diffuse erythema distal to area. Is currently taking clindamycine for recent abscess to groin area.

## 2019-09-21 LAB — AEROBIC/ANAEROBIC CULTURE (SURGICAL/DEEP WOUND): Special Requests: NORMAL

## 2019-09-21 LAB — AEROBIC/ANAEROBIC CULTURE W GRAM STAIN (SURGICAL/DEEP WOUND)

## 2019-09-22 NOTE — Progress Notes (Signed)
Please call patient Assess his response to treatment His abscess grew unusual bacteria - probably because he was taking an antibiotic when it developed.  That promotes resistance. He NEEDS follow up, I will refer to ID if he does not have a PCP.  He has recurrent skin infections and is a diabetic

## 2019-09-25 ENCOUNTER — Telehealth (HOSPITAL_COMMUNITY): Payer: Self-pay | Admitting: Family Medicine

## 2019-09-25 NOTE — Telephone Encounter (Signed)
Patient was called by RN to discuss culture results and to ascertain improvement Patient desires to see ID in consultation due to recurring infections, and unusual pathogens identified on culture.  Will place referral.

## 2019-10-07 ENCOUNTER — Ambulatory Visit (INDEPENDENT_AMBULATORY_CARE_PROVIDER_SITE_OTHER): Payer: Medicare HMO | Admitting: Internal Medicine

## 2019-10-07 ENCOUNTER — Encounter: Payer: Self-pay | Admitting: Internal Medicine

## 2019-10-07 ENCOUNTER — Other Ambulatory Visit: Payer: Self-pay

## 2019-10-07 DIAGNOSIS — M1000 Idiopathic gout, unspecified site: Secondary | ICD-10-CM | POA: Diagnosis not present

## 2019-10-07 DIAGNOSIS — E669 Obesity, unspecified: Secondary | ICD-10-CM | POA: Insufficient documentation

## 2019-10-07 DIAGNOSIS — E0800 Diabetes mellitus due to underlying condition with hyperosmolarity without nonketotic hyperglycemic-hyperosmolar coma (NKHHC): Secondary | ICD-10-CM

## 2019-10-07 DIAGNOSIS — M109 Gout, unspecified: Secondary | ICD-10-CM | POA: Insufficient documentation

## 2019-10-07 DIAGNOSIS — L0293 Carbuncle, unspecified: Secondary | ICD-10-CM | POA: Insufficient documentation

## 2019-10-07 DIAGNOSIS — L304 Erythema intertrigo: Secondary | ICD-10-CM

## 2019-10-07 DIAGNOSIS — F17210 Nicotine dependence, cigarettes, uncomplicated: Secondary | ICD-10-CM | POA: Diagnosis not present

## 2019-10-07 DIAGNOSIS — I10 Essential (primary) hypertension: Secondary | ICD-10-CM | POA: Diagnosis not present

## 2019-10-07 DIAGNOSIS — Z96643 Presence of artificial hip joint, bilateral: Secondary | ICD-10-CM

## 2019-10-07 DIAGNOSIS — E119 Type 2 diabetes mellitus without complications: Secondary | ICD-10-CM | POA: Insufficient documentation

## 2019-10-07 MED ORDER — FLUCONAZOLE 100 MG PO TABS: 100.0000 mg | ORAL_TABLET | Freq: Every day | ORAL | 0 refills | Status: DC

## 2019-10-07 MED ORDER — DOXYCYCLINE HYCLATE 100 MG PO TABS: 100.0000 mg | ORAL_TABLET | Freq: Two times a day (BID) | ORAL | 0 refills | Status: DC

## 2019-10-07 NOTE — Assessment & Plan Note (Signed)
I will check A1c level today.

## 2019-10-07 NOTE — Assessment & Plan Note (Signed)
He has recurrent boils.  I told him that his obesity, poorly controlled diabetes and cigarette use are his major risk factors.  He believes that he can modify his diet and lose weight again.  I will treat his early boil with doxycycline and see him back in 2 to 3 weeks.

## 2019-10-07 NOTE — Assessment & Plan Note (Signed)
I suspect that he has Candida intertrigo rather than tinea.  I will treat him with fluconazole.

## 2019-10-07 NOTE — Assessment & Plan Note (Signed)
I encouraged him to consider cutting down and quitting cigarettes.

## 2019-10-07 NOTE — Progress Notes (Signed)
[facility]  Reason for Consult: Recurrent boils Referring Provider: Dr. Raylene Everts  Assessment: He has recurrent boils.  I told him that his obesity, poorly controlled diabetes and cigarette use are his major risk factors.  He believes that he can modify his diet and lose weight again.  I will treat his early boil with doxycycline and see him back in 2 to 3 weeks.  I suspect that he has Candida intertrigo rather than tinea.  I will treat him with fluconazole.   I will check A1c level today.  I encouraged him to consider cutting down and quitting cigarettes.  Plan: 1. Doxycycline x7 days and fluconazole x14 days 2. Check A1c level 3. Follow-up in 2 to 3 weeks  Patient Active Problem List   Diagnosis Date Noted  . Recurrent boils 10/07/2019    Priority: High  . Intertrigo 10/07/2019    Priority: High  . Diabetes mellitus ([place]) 10/07/2019  . Hypertension 10/07/2019  . Gout 10/07/2019  . Cigarette smoker 10/07/2019  . History of hip replacement, total, bilateral 10/07/2019  . Obesity 10/07/2019    Patient's Medications  New Prescriptions   DOXYCYCLINE (VIBRA-TABS) 100 MG TABLET    Take 1 tablet (100 mg total) by mouth 2 (two) times daily.   FLUCONAZOLE (DIFLUCAN) 100 MG TABLET    Take 1 tablet (100 mg total) by mouth daily.  Previous Medications   BLOOD GLUCOSE METER KIT AND SUPPLIES KIT    Dispense based on patient and insurance preference. Use up to four times daily as directed. (FOR ICD-9 250.00, 250.01).   CLINDAMYCIN (CLEOCIN) 300 MG CAPSULE    Take 1 capsule (300 mg total) by mouth 3 (three) times daily.   GLIPIZIDE (GLUCOTROL) 5 MG TABLET    Take 5 mg by mouth daily before breakfast.   HYDROCODONE-ACETAMINOPHEN ([place]) 7.5-325 MG TABLET    Take 1 tablet by mouth every 6 (six) hours as needed for moderate pain.   INSULIN GLARGINE (LANTUS) 100 UNIT/ML INJECTION    Inject 70 Units into the skin at bedtime.   LOSARTAN-HYDROCHLOROTHIAZIDE (HYZAAR) 100-12.5 MG TABLET    Take 1 tablet by mouth daily.  Modified Medications   No medications on file  Discontinued Medications   LANTUS SOLOSTAR 100 UNIT/ML SOLOSTAR PEN        HPI: Kevin Mccarty is a 29 y.o. male with obesity, hypertension and cigarette use who was diagnosed with diabetes about 3 years ago when living in [place], [place].  He recalls that his last hemoglobin A1c was around 10 sometime last year.  He moved here to [facility] in January to be closer to family.  He just recently got a new glucose meter and his blood sugars have been running in the 200-300 range.  He does not have a PCP here.  He has a long history of recurrent boils.  He recalls having several lanced while living in [place].  He was seen in the emergency department on 08/28/2019 with a small scrotal abscess.  He was treated with trimethoprim sulfamethoxazole and it resolved.  He was seen again at urgent care on 09/16/2019 with a left flank abscess.  He underwent incision and drainage.  Gram stain showed gram-positive cocci, gram-positive rods and gram-negative rods.  Cultures grew actinomyces and Propionibacterium.  His infection resolved with clindamycin which he finished 4 days ago.  Immediately after stopping clindamycin he started having tender swelling under his right breast.  He has also been suffering with a pruritic rash in his groin.  He smokes about 1/2 pack of cigarettes daily.  He is not employed.  Review of Systems: Review of Systems  Constitutional: Negative for chills, fever and weight loss.       He tells me that he lost 100 pounds last year by changing his diet but has gained it all back.  Respiratory: Negative for cough.   Cardiovascular: Negative for chest pain.  Gastrointestinal: Negative for abdominal pain, diarrhea, nausea and vomiting.  Genitourinary: Negative for dysuria.  Musculoskeletal: Negative for joint pain.       He has had bilateral total hip  arthroplasties.      Past Medical History:  Diagnosis Date  . Diabetes mellitus without complication ([place])   . Gout   . Hypertension     Social History   Tobacco Use  . Smoking status: Current Every Day Smoker    Packs/day: 0.50    Years: 5.00    Pack years: 2.50    Types: Cigarettes  . Smokeless tobacco: Never Used  Substance Use Topics  . Alcohol use: No  . Drug use: No    Family History  Problem Relation Age of Onset  . Diabetes Father    Allergies  Allergen Reactions  . Tramadol Nausea And Vomiting    OBJECTIVE: Vitals:   10/07/19 1517  BP: 116/73  Pulse: 98  Temp: 98.7 F (37.1 C)  TempSrc: Oral  SpO2: 96%  Weight: (!) 444 lb (201.4 kg)  Height: 6' 1"  (1.854 m)   Body mass index is 58.58 kg/m.   Physical Exam Constitutional:      Comments: He is pleasant and in no distress.  He is morbidly obese.  Cardiovascular:     Rate and Rhythm: Normal rate and regular rhythm.     Heart sounds: No murmur.  Pulmonary:     Effort: Pulmonary effort is normal.     Breath sounds: Normal breath sounds.  Abdominal:     Palpations: Abdomen is soft.     Tenderness: There is no abdominal tenderness.  Musculoskeletal:        General: No swelling or tenderness.  Skin:    Comments: He has moist slightly erythematous lesions in his groin bilaterally.  He has a healing scar on his left flank where an abscess was drained.  There is a nickel sized area of swelling and tenderness under his right breast.  It is not fluctuant.  Neurological:     General: No focal deficit present.  Psychiatric:        Mood and Affect: Mood normal.     Microbiology: No results found for this or any previous visit (from the past 240 hour(s)).  Michel Bickers, MD [facility] [phone] pager   [phone] cell 10/07/2019, 3:53 PM

## 2019-10-08 LAB — HEMOGLOBIN A1C: Hgb A1c MFr Bld: >14 % of total Hgb — ABNORMAL HIGH (ref <5.7)

## 2019-10-30 ENCOUNTER — Ambulatory Visit: Payer: Medicare Other | Admitting: Internal Medicine

## 2019-12-17 ENCOUNTER — Encounter (HOSPITAL_COMMUNITY): Payer: Self-pay

## 2019-12-17 ENCOUNTER — Emergency Department (HOSPITAL_COMMUNITY)
Admission: EM | Admit: 2019-12-17 | Discharge: 2019-12-18 | Disposition: A | Discharge status: Home / Self Care | Payer: Medicare HMO | Attending: Emergency Medicine | Admitting: Emergency Medicine

## 2019-12-17 ENCOUNTER — Emergency Department (HOSPITAL_COMMUNITY): Payer: Medicare HMO

## 2019-12-17 DIAGNOSIS — E119 Type 2 diabetes mellitus without complications: Secondary | ICD-10-CM | POA: Insufficient documentation

## 2019-12-17 DIAGNOSIS — M25561 Pain in right knee: Secondary | ICD-10-CM

## 2019-12-17 DIAGNOSIS — R079 Chest pain, unspecified: Secondary | ICD-10-CM | POA: Insufficient documentation

## 2019-12-17 DIAGNOSIS — M25562 Pain in left knee: Secondary | ICD-10-CM | POA: Insufficient documentation

## 2019-12-17 DIAGNOSIS — F17210 Nicotine dependence, cigarettes, uncomplicated: Secondary | ICD-10-CM | POA: Insufficient documentation

## 2019-12-17 DIAGNOSIS — W19XXXA Unspecified fall, initial encounter: Secondary | ICD-10-CM

## 2019-12-17 DIAGNOSIS — W010XXA Fall on same level from slipping, tripping and stumbling without subsequent striking against object, initial encounter: Secondary | ICD-10-CM | POA: Diagnosis not present

## 2019-12-17 DIAGNOSIS — I10 Essential (primary) hypertension: Secondary | ICD-10-CM | POA: Diagnosis not present

## 2019-12-17 LAB — BASIC METABOLIC PANEL
Anion gap: 10 (ref 5–15)
BUN: 12 mg/dL (ref 6–20)
CO2: 26 mmol/L (ref 22–32)
Calcium: 9.4 mg/dL (ref 8.9–10.3)
Chloride: 100 mmol/L (ref 98–111)
Creatinine, Ser: 0.82 mg/dL (ref 0.61–1.24)
GFR calc Af Amer: >60 mL/min (ref >60)
GFR calc non Af Amer: >60 mL/min (ref >60)
Glucose, Bld: 351 mg/dL — ABNORMAL HIGH (ref 70–99)
Potassium: 4.3 mmol/L (ref 3.5–5.1)
Sodium: 136 mmol/L (ref 135–145)

## 2019-12-17 LAB — CBC
HCT: 45.4 % (ref 39.0–52.0)
Hemoglobin: 14.9 g/dL (ref 13.0–17.0)
MCH: 26.4 pg (ref 26.0–34.0)
MCHC: 32.8 g/dL (ref 30.0–36.0)
MCV: 80.5 fL (ref 80.0–100.0)
Platelets: 245 10*3/uL (ref 150–400)
RBC: 5.64 MIL/uL (ref 4.22–5.81)
RDW: 13.5 % (ref 11.5–15.5)
WBC: 8.3 10*3/uL (ref 4.0–10.5)
nRBC: 0 % (ref 0.0–0.2)

## 2019-12-17 LAB — TROPONIN I (HIGH SENSITIVITY): Troponin I (High Sensitivity): 9 ng/L (ref <18)

## 2019-12-17 NOTE — ED Triage Notes (Signed)
Pt arrives POV for eval of blt knee pain after fall yesterday. Pt also reports chest pain onset earlier today, denies aggravating or alleviating factors.

## 2019-12-18 DIAGNOSIS — R079 Chest pain, unspecified: Secondary | ICD-10-CM | POA: Diagnosis not present

## 2019-12-18 LAB — TROPONIN I (HIGH SENSITIVITY): Troponin I (High Sensitivity): 5 ng/L (ref <18)

## 2019-12-18 NOTE — ED Provider Notes (Signed)
[facility] Emergency Department Provider Note MRN:  [id number]  Arrival date & time: 12/18/19     Chief Complaint   Knee Pain and Chest Pain   History of Present Illness   Kevin Mccarty is a 29 y.o. year-old male with a history of hypertension, diabetes presenting to the ED with chief complaint of knee pain and chest pain.  Patient tripped and fell 2 days ago, denies any chest pain or dizziness preceding the fall.  Fell onto both of his knees.  Endorsing continued knee soreness since that time.  Yesterday began having sharp pain in the center of the chest, worse with certain motions, worse with palpation.  Pain is mild to moderate, constant.  Denies fever or cough, no abdominal pain, no other complaints.  Denies dizziness or diaphoresis, no nausea or vomiting, no shortness of breath.  Review of Systems  A complete 10 system review of systems was obtained and all systems are negative except as noted in the HPI and PMH.   Patient's Health History    Past Medical History:  Diagnosis Date  . Diabetes mellitus without complication (HCC)   . Gout   . Hypertension     Past Surgical History:  Procedure Laterality Date  . TOTAL HIP ARTHROPLASTY Bilateral     Family History  Problem Relation Age of Onset  . Diabetes Father     Social History   Socioeconomic History  . Marital status: Single    Spouse name: Not on file  . Number of children: Not on file  . Years of education: Not on file  . Highest education level: Not on file  Occupational History  . Not on file  Tobacco Use  . Smoking status: Current Every Day Smoker    Packs/day: 0.50    Years: 5.00    Pack years: 2.50    Types: Cigarettes  . Smokeless tobacco: Never Used  Substance and Sexual Activity  . Alcohol use: No  . Drug use: No  . Sexual activity: Not Currently  Other Topics Concern  . Not on file  Social History Narrative  . Not on file   Social Determinants of Health   Financial  Resource Strain:   . Difficulty of Paying Living Expenses:   Food Insecurity:   . Worried About Programme researcher, broadcasting/film/video in the Last Year:   . Barista in the Last Year:   Transportation Needs:   . Freight forwarder (Medical):   Marland Kitchen Lack of Transportation (Non-Medical):   Physical Activity:   . Days of Exercise per Week:   . Minutes of Exercise per Session:   Stress:   . Feeling of Stress :   Social Connections:   . Frequency of Communication with Friends and Family:   . Frequency of Social Gatherings with Friends and Family:   . Attends Religious Services:   . Active Member of Clubs or Organizations:   . Attends Banker Meetings:   Marland Kitchen Marital Status:   Intimate Partner Violence:   . Fear of Current or Ex-Partner:   . Emotionally Abused:   Marland Kitchen Physically Abused:   . Sexually Abused:      Physical Exam   Vitals:   12/18/19 0748 12/18/19 0800  BP: (!) 115/98   Pulse: 92 82  Resp: 19 19  Temp:    SpO2: 93% 99%    CONSTITUTIONAL: Well-appearing, NAD NEURO:  Alert and oriented x 3, no focal deficits EYES:  eyes equal and reactive ENT/NECK:  no LAD, no JVD CARDIO: Regular rate, well-perfused, normal S1 and S2 PULM:  CTAB no wheezing or rhonchi GI/GU:  normal bowel sounds, non-distended, non-tender MSK/SPINE:  No gross deformities, no edema SKIN:  no rash, atraumatic PSYCH:  Appropriate speech and behavior  *Additional and/or pertinent findings included in MDM below  Diagnostic and Interventional Summary    EKG Interpretation  Date/Time:  Wednesday December 17 2019 22:39:54 EDT Ventricular Rate:  87 PR Interval:  142 QRS Duration: 78 QT Interval:  348 QTC Calculation: 418 R Axis:   23 Text Interpretation: Normal sinus rhythm Nonspecific T wave abnormality Abnormal ECG No STEMI Confirmed by Alvester Chou [phone]) on 12/18/2019 7:12:54 AM      Labs Reviewed  BASIC METABOLIC PANEL - Abnormal; Notable for the following components:      Result Value     Glucose, Bld 351 (*)    All other components within normal limits  CBC  TROPONIN I (HIGH SENSITIVITY)  TROPONIN I (HIGH SENSITIVITY)    DG Chest 2 View  Final Result    DG Knee Complete 4 Views Right  Final Result    DG Knee Complete 4 Views Left  Final Result      Medications - No data to display   Procedures  /  Critical Care Procedures  ED Course and Medical Decision Making  I have reviewed the triage vital signs, the nursing notes, and pertinent available records from the EMR.  Listed above are laboratory and imaging tests that I personally ordered, reviewed, and interpreted and then considered in my medical decision making (see below for details).      Suspect MSK etiology of patient's chest pain given the recent fall and knee soreness.  There is no evidence of DVT, no shortness of breath, no tachycardia, doubt PE, PERC negative.  Does have cardiovascular risk factors, namely hypertension, diabetes, obesity.  Fortunately the EKG is reassuring and troponin is negative x2, largely excluding ACS.  Patient is appropriate for discharge on anti-inflammatories.    Elmer Sow. Pilar Plate, MD [facility] Emergency Medicine [facility] [email or web address]  Final Clinical Impressions(s) / ED Diagnoses     ICD-10-CM   1. Fall, initial encounter  W19.XXXA   2. Acute pain of both knees  M25.561    M25.562   3. Chest pain, unspecified type  R07.9     ED Discharge Orders    None       Discharge Instructions Discussed with and Provided to Patient:     Discharge Instructions     You were evaluated in the Emergency Department and after careful evaluation, we did not find any emergent condition requiring admission or further testing in the hospital.  Your exam/testing today was overall reassuring.  Your symptoms seem to be due to strain or sprain of the muscles in the chest due to the fall.  Your knees are also likely bruised but the x-rays did not show any  broken bones.  We recommend Tylenol or Motrin at home for discomfort.  Please return to the Emergency Department if you experience any worsening of your condition.  Thank you for allowing [place] to be a part of your care.       Sabas Sous, MD 12/18/19 [phone]

## 2019-12-18 NOTE — Discharge Instructions (Addendum)
You were evaluated in the Emergency Department and after careful evaluation, we did not find any emergent condition requiring admission or further testing in the hospital.  Your exam/testing today was overall reassuring.  Your symptoms seem to be due to strain or sprain of the muscles in the chest due to the fall.  Your knees are also likely bruised but the x-rays did not show any broken bones.  We recommend Tylenol or Motrin at home for discomfort.  Please return to the Emergency Department if you experience any worsening of your condition.  Thank you for allowing [place] to be a part of your care.

## 2019-12-19 ENCOUNTER — Other Ambulatory Visit: Payer: Self-pay

## 2019-12-19 ENCOUNTER — Ambulatory Visit (INDEPENDENT_AMBULATORY_CARE_PROVIDER_SITE_OTHER): Payer: Medicare HMO

## 2019-12-19 ENCOUNTER — Ambulatory Visit (HOSPITAL_COMMUNITY)
Admission: EM | Admit: 2019-12-19 | Discharge: 2019-12-19 | Disposition: A | Discharge status: Home / Self Care | Payer: Medicare HMO | Attending: Emergency Medicine | Admitting: Emergency Medicine

## 2019-12-19 ENCOUNTER — Encounter (HOSPITAL_COMMUNITY): Payer: Self-pay

## 2019-12-19 DIAGNOSIS — W19XXXA Unspecified fall, initial encounter: Secondary | ICD-10-CM | POA: Diagnosis not present

## 2019-12-19 DIAGNOSIS — M25562 Pain in left knee: Secondary | ICD-10-CM

## 2019-12-19 DIAGNOSIS — S8002XA Contusion of left knee, initial encounter: Secondary | ICD-10-CM

## 2019-12-19 MED ORDER — NAPROXEN 500 MG PO TABS
500.0000 mg | ORAL_TABLET | Freq: Two times a day (BID) | ORAL | 0 refills | Status: DC
Start: 2019-12-19 — End: 2020-01-07

## 2019-12-19 NOTE — ED Triage Notes (Signed)
Pt slipped on water and fell on both knees. Pt c/o 8/10 throbbing pain in knees bilat. Pt has a lump the size of a quarter on the right knee under the skin. Pt was able to walk to triage.

## 2019-12-19 NOTE — Discharge Instructions (Signed)
Your xray is normal tonight which is reassuring.  Ice, elevation, use of ace wrap as needed for pain.  Naproxen twice a day as needed for pain.

## 2019-12-20 NOTE — ED Provider Notes (Signed)
[facility]    CSN: [id number] Arrival date & time: 12/19/19  1810      History   Chief Complaint Chief Complaint  Patient presents with  . Knee Injury    HPI Kevin Mccarty is a 29 y.o. male.   Kevin Mccarty presents with complaints of knee pain, predominantly his left knee, following a fall onto his knees onto a hard, linoleum like surface. Fall was on 7/13. States he slipped on liquid, landing onto his knees. He has been ambulatory since the fall but with pain. History of hip surgery bilaterally, no hip pain. No numbness tingling or weakness to the lower legs. Hasn't taken any medications for pain. Denies any previous knee injuries. Pain primarily with left knee flexion.    ROS per HPI, negative if not otherwise mentioned.      Past Medical History:  Diagnosis Date  . Diabetes mellitus without complication ([place])   . Gout   . Hypertension     Patient Active Problem List   Diagnosis Date Noted  . Diabetes mellitus ([place]) 10/07/2019  . Hypertension 10/07/2019  . Gout 10/07/2019  . Cigarette smoker 10/07/2019  . 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 History of hip replacement, total, bilateral 10/07/2019  . Obesity 10/07/2019  . Intertrigo 10/07/2019    Past Surgical History:  Procedure Laterality Date  . TOTAL HIP ARTHROPLASTY Bilateral        Home Medications    Prior to Admission medications   Medication Sig Start Date End Date Taking? Authorizing Provider  blood glucose meter kit and supplies KIT Dispense based on patient and insurance preference. Use up to four times daily as directed. (FOR ICD-9 250.00, 250.01). 08/25/19   Robyn Haber, MD  clindamycin (CLEOCIN) 300 MG capsule Take 1 capsule (300 mg total) by mouth 3 (three) times daily. Patient not taking: Reported on 10/07/2019 09/16/19   Raylene Everts, MD  doxycycline (VIBRA-TABS) 100 MG tablet Take 1 tablet (100 mg total) by mouth 2 (two) times daily. 10/07/19   Michel Bickers, MD  fluconazole  (DIFLUCAN) 100 MG tablet Take 1 tablet (100 mg total) by mouth daily. 10/07/19   Michel Bickers, MD  glipiZIDE (GLUCOTROL) 5 MG tablet Take 5 mg by mouth daily before breakfast.    [provider]  HYDROcodone-acetaminophen (NORCO) 7.5-325 MG tablet Take 1 tablet by mouth every 6 (six) hours as needed for moderate pain. Patient not taking: Reported on 10/07/2019 09/16/19   Raylene Everts, MD  insulin glargine (LANTUS) 100 UNIT/ML injection Inject 70 Units into the skin at bedtime.    [provider]  losartan-hydrochlorothiazide (HYZAAR) 100-12.5 MG tablet Take 1 tablet by mouth daily.    [provider]  naproxen (NAPROSYN) 500 MG tablet Take 1 tablet (500 mg total) by mouth 2 (two) times daily. 12/19/19   Zigmund Gottron, NP    Family History Family History  Problem Relation Age of Onset  . Diabetes Father     Social History Social History   Tobacco Use  . Smoking status: Current Every Day Smoker    Packs/day: 0.50    Years: 5.00    Pack years: 2.50    Types: Cigarettes  . Smokeless tobacco: Never Used  Substance Use Topics  . Alcohol use: No  . Drug use: No     Allergies   Tramadol   Review of Systems Review of Systems   Physical Exam Triage Vital Signs ED Triage Vitals  Enc Vitals Group  BP 12/19/19 1857 135/82     Pulse Rate 12/19/19 1857 92     Resp 12/19/19 1857 18     Temp 12/19/19 1857 98.2 F (36.8 C)     Temp Source 12/19/19 1857 Oral     SpO2 12/19/19 1857 98 %     Weight 12/19/19 1900 (!) 398 lb (180.5 kg)     Height 12/19/19 1900 _0  (1.753 m)     Head Circumference --      Peak Flow --      Pain Score 12/19/19 1859 8     Pain Loc --      Pain Edu? --      Excl. in [place]? --    No data found.  Updated Vital Signs BP 135/82   Pulse 92   Temp 98.2 F (36.8 C) (Oral)   Resp 18   Ht _1  (1.753 m)   Wt (!) 398 lb (180.5 kg)   SpO2 98%   BMI 58.77 kg/m   Visual Acuity Right Eye Distance:   Left Eye  Distance:   Bilateral Distance:    Right Eye Near:   Left Eye Near:    Bilateral Near:     Physical Exam Constitutional:      Appearance: He is well-developed.  Cardiovascular:     Rate and Rhythm: Normal rate.  Pulmonary:     Effort: Pulmonary effort is normal.  Musculoskeletal:     Left knee: Bony tenderness present. Decreased range of motion. Tenderness present.     Comments: Tenderness to very proximal left tibia without visible redness, mild swelling noted although somewhat difficulty to assess due to body habitus at baseline; pain noted with flexion greater than 90 deg; strength equal bilaterally; gross sensation intact; ambulatory without apparent difficulty   Skin:    General: Skin is warm and dry.  Neurological:     Mental Status: He is alert and oriented to person, place, and time.      UC Treatments / Results  Labs (all labs ordered are listed, but only abnormal results are displayed) Labs Reviewed - No data to display  EKG   Radiology DG Knee Complete 4 Views Left  Result Date: 12/19/2019 CLINICAL DATA:  29 year old male with fall and left knee pain. EXAM: LEFT KNEE - COMPLETE 4+ VIEW COMPARISON:  Left knee radiograph dated 12/17/2019. FINDINGS: There is no acute fracture or dislocation. The bones are well mineralized. No significant degenerative changes. No joint effusion. The soft tissues are unremarkable. IMPRESSION: Negative. Electronically Signed   By: Anner Crete M.D.   On: 12/19/2019 19:45    Procedures Procedures (including critical care time)  Medications Ordered in UC Medications - No data to display  Initial Impression / Assessment and Plan / UC Course  I have reviewed the triage vital signs and the nursing notes.  Pertinent labs & imaging results that were available during my care of the patient were reviewed by me and considered in my medical decision making (see chart for details).     Xray without acute findings, s/p fall onto knees  after slip. Consistent with contusion. Pain management discussed. Follow up recommendations provided. Patient verbalized understanding and agreeable to plan.  Ambulatory out of clinic without difficulty.    Final Clinical Impressions(s) / UC Diagnoses   Final diagnoses:  Contusion of left knee, initial encounter     Discharge Instructions     Your xray is normal tonight which is reassuring.  Ice, elevation,  use of ace wrap as needed for pain.  Naproxen twice a day as needed for pain.    ED Prescriptions    Medication Sig Dispense Auth. Provider   naproxen (NAPROSYN) 500 MG tablet Take 1 tablet (500 mg total) by mouth 2 (two) times daily. 30 tablet Zigmund Gottron, NP     PDMP not reviewed this encounter.   Zigmund Gottron, NP 12/20/19 2002

## 2020-01-07 ENCOUNTER — Other Ambulatory Visit: Payer: Self-pay

## 2020-01-07 ENCOUNTER — Ambulatory Visit (HOSPITAL_COMMUNITY)
Admission: EM | Admit: 2020-01-07 | Discharge: 2020-01-07 | Disposition: A | Discharge status: Home / Self Care | Payer: Medicare HMO | Attending: Family Medicine | Admitting: Family Medicine

## 2020-01-07 ENCOUNTER — Encounter (HOSPITAL_COMMUNITY): Payer: Self-pay

## 2020-01-07 DIAGNOSIS — E119 Type 2 diabetes mellitus without complications: Secondary | ICD-10-CM | POA: Insufficient documentation

## 2020-01-07 DIAGNOSIS — M25362 Other instability, left knee: Secondary | ICD-10-CM | POA: Insufficient documentation

## 2020-01-07 DIAGNOSIS — I10 Essential (primary) hypertension: Secondary | ICD-10-CM | POA: Diagnosis not present

## 2020-01-07 DIAGNOSIS — R05 Cough: Secondary | ICD-10-CM | POA: Insufficient documentation

## 2020-01-07 DIAGNOSIS — Z96643 Presence of artificial hip joint, bilateral: Secondary | ICD-10-CM | POA: Diagnosis not present

## 2020-01-07 DIAGNOSIS — J069 Acute upper respiratory infection, unspecified: Secondary | ICD-10-CM | POA: Insufficient documentation

## 2020-01-07 DIAGNOSIS — F17210 Nicotine dependence, cigarettes, uncomplicated: Secondary | ICD-10-CM | POA: Diagnosis not present

## 2020-01-07 DIAGNOSIS — Z20822 Contact with and (suspected) exposure to covid-19: Secondary | ICD-10-CM | POA: Diagnosis not present

## 2020-01-07 DIAGNOSIS — Z888 Allergy status to other drugs, medicaments and biological substances status: Secondary | ICD-10-CM | POA: Insufficient documentation

## 2020-01-07 MED ORDER — HYDROCODONE-ACETAMINOPHEN 5-325 MG PO TABS: 1.0000 | ORAL_TABLET | Freq: Four times a day (QID) | ORAL | 0 refills | Status: DC | PRN

## 2020-01-07 MED ORDER — DICLOFENAC SODIUM 75 MG PO TBEC
75.0000 mg | DELAYED_RELEASE_TABLET | Freq: Two times a day (BID) | ORAL | 0 refills | Status: AC
Start: 2020-01-07 — End: ?

## 2020-01-07 MED ORDER — PSEUDOEPH-BROMPHEN-DM 30-2-10 MG/5ML PO SYRP: 5.0000 mL | ORAL_SOLUTION | Freq: Four times a day (QID) | ORAL | 0 refills | Status: AC | PRN

## 2020-01-07 NOTE — Discharge Instructions (Addendum)
Be aware, you have been prescribed pain medications that may cause drowsiness. While taking this medication, do not take any other medications containing acetaminophen (Tylenol). Do not combine with alcohol or other illicit drugs. Please do not drive, operate heavy machinery, or take part in activities that require making important decisions while on this medication as your judgement may be clouded.  You have been tested for COVID-19 today. If your test returns positive, you will receive a phone call from [facility] regarding your results. Negative test results are not called. Both positive and negative results area always visible on MyChart. If you do not have a MyChart account, sign up instructions are provided in your discharge papers. Please do not hesitate to contact us should you have questions or concerns.

## 2020-01-07 NOTE — ED Triage Notes (Signed)
Pt presents with cough and nasal congestion x 1 day; left knee pain x 3 weeks, after he fell on the ground. Knee pain worse when walking.

## 2020-01-08 LAB — SARS CORONAVIRUS 2 (TAT 6-24 HRS): SARS Coronavirus 2: NEGATIVE

## 2020-01-08 NOTE — ED Provider Notes (Signed)
[facility]   [id number] 01/07/20 Arrival Time: 1834  ASSESSMENT & PLAN:  1. Viral URI with cough   2. Knee instability, left      COVID-19 testing sent. See letter/work note on file for self-isolation guidelines. OTC symptom care as needed.  Recommend:  Follow-up Information    Schedule an appointment as soon as possible for a visit  with [facility].   Why: To evaluaate your knee. Contact information: [address] [place] [address] [phone]              Reviewed expectations re: course of current medical issues. Questions answered. Outlined signs and symptoms indicating need for more acute intervention. Understanding verbalized. After Visit Summary given.   SUBJECTIVE: History from: patient. Kevin Mccarty is a 29 y.o. male who reports cough and nasal congestion for one day.  Known COVID-19 contact: none. Recent travel: none.  Denies: difficulty breathing and headache. Normal PO intake without n/v/d. Also with left knee pain; distant injury; "now feel like my knee gives out on me randomly". No extremity sensation changes or weakness. No OTC tx.    OBJECTIVE:  Vitals:   01/07/20 1944  BP: (!) 144/100  Pulse: 99  Resp: (!) 22  Temp: 98.3 F (36.8 C)  TempSrc: Oral  SpO2: 95%   RR 18 on recheck.  General appearance: alert; no distress Eyes: PERRLA; EOMI; conjunctiva normal HENT: [place]; AT; mild nasal congestion Neck: supple  Lungs: speaks full sentences without difficulty; unlabored Extremities: no edema; L knee with overall normal exam Skin: warm and dry Neurologic: normal gait Psychological: alert and cooperative; normal mood and affect  Labs:  Labs Reviewed  SARS CORONAVIRUS 2 (TAT 6-24 HRS)      Allergies  Allergen Reactions  . Tramadol Nausea And Vomiting    Past Medical History:  Diagnosis Date  . Diabetes mellitus without complication (HCC)   . Gout   . Hypertension      Social History   Socioeconomic History  . Marital status: Single    Spouse name: Not on file  . Number of children: Not on file  . Years of education: Not on file  . Highest education level: Not on file  Occupational History  . Not on file  Tobacco Use  . Smoking status: Current Every Day Smoker    Packs/day: 0.50    Years: 5.00    Pack years: 2.50    Types: Cigarettes  . Smokeless tobacco: Never Used  Substance and Sexual Activity  . Alcohol use: No  . Drug use: No  . Sexual activity: Not Currently  Other Topics Concern  . Not on file  Social History Narrative  . Not on file   Social Determinants of Health   Financial Resource Strain:   . Difficulty of Paying Living Expenses:   Food Insecurity:   . Worried About Programme researcher, broadcasting/film/video in the Last Year:   . Barista in the Last Year:   Transportation Needs:   . Freight forwarder (Medical):   Marland Kitchen Lack of Transportation (Non-Medical):   Physical Activity:   . Days of Exercise per Week:   . Minutes of Exercise per Session:   Stress:   . Feeling of Stress :   Social Connections:   . Frequency of Communication with Friends and Family:   . Frequency of Social Gatherings with Friends and Family:   . Attends Religious Services:   .  Active Member of Clubs or Organizations:   . Attends Banker Meetings:   Marland Kitchen Marital Status:   Intimate Partner Violence:   . Fear of Current or Ex-Partner:   . Emotionally Abused:   Marland Kitchen Physically Abused:   . Sexually Abused:    Family History  Problem Relation Age of Onset  . Diabetes Father    Past Surgical History:  Procedure Laterality Date  . TOTAL HIP ARTHROPLASTY Bilateral      Mardella Layman, MD 01/08/20 [phone]

## 2020-01-27 ENCOUNTER — Other Ambulatory Visit: Payer: Self-pay

## 2020-01-27 ENCOUNTER — Encounter (HOSPITAL_COMMUNITY): Payer: Self-pay

## 2020-01-27 ENCOUNTER — Ambulatory Visit (HOSPITAL_COMMUNITY)
Admission: EM | Admit: 2020-01-27 | Discharge: 2020-01-27 | Disposition: A | Discharge status: Home / Self Care | Payer: Medicare HMO | Attending: Internal Medicine | Admitting: Internal Medicine

## 2020-01-27 DIAGNOSIS — L0293 Carbuncle, unspecified: Secondary | ICD-10-CM

## 2020-01-27 DIAGNOSIS — N481 Balanitis: Secondary | ICD-10-CM | POA: Diagnosis not present

## 2020-01-27 DIAGNOSIS — L0291 Cutaneous abscess, unspecified: Secondary | ICD-10-CM | POA: Diagnosis not present

## 2020-01-27 MED ORDER — IBUPROFEN 800 MG PO TABS
800.0000 mg | ORAL_TABLET | Freq: Three times a day (TID) | ORAL | 0 refills | Status: AC
Start: 2020-01-27 — End: ?

## 2020-01-27 MED ORDER — KETOROLAC TROMETHAMINE 60 MG/2ML IM SOLN
60.0000 mg | Freq: Once | INTRAMUSCULAR | Status: AC
  Administered 2020-01-27: 60 mg via INTRAMUSCULAR

## 2020-01-27 MED ORDER — MICONAZOLE NITRATE 2 % EX AERP: INHALATION_SPRAY | CUTANEOUS | 0 refills | Status: DC

## 2020-01-27 MED ORDER — KETOROLAC TROMETHAMINE 60 MG/2ML IM SOLN
INTRAMUSCULAR | Status: AC
  Filled 2020-01-27: qty 2

## 2020-01-27 MED ORDER — DOXYCYCLINE HYCLATE 100 MG PO TABS: 100.0000 mg | ORAL_TABLET | Freq: Two times a day (BID) | ORAL | 0 refills | Status: AC

## 2020-01-27 MED ORDER — CLOTRIMAZOLE-BETAMETHASONE 1-0.05 % EX CREA
TOPICAL_CREAM | CUTANEOUS | 0 refills | Status: AC
Start: 2020-01-27 — End: ?

## 2020-01-27 NOTE — ED Triage Notes (Signed)
Pt is here with abscess's one is underneath right armpit and the other is in the crease of his left leg these appeared 3 days  go. Pt also complaints of penile burning when urinating that started 3 days ago. Pt has not taken any meds to relieve discomfort.

## 2020-01-27 NOTE — Discharge Instructions (Signed)
If you notice worsening swelling, drainage, fever, chills, pain-please return to the urgent care to be reevaluated.

## 2020-01-27 NOTE — ED Provider Notes (Signed)
[place]    CSN: [id number] Arrival date & time: 01/27/20  1446      History   Chief Complaint Chief Complaint  Patient presents with  . Abscess    HPI Kevin Mccarty is a 29 y.o. male comes to the urgent care with painful right axillary swelling as well as left groin swelling of 3 days duration.  Patient says symptoms have been persistent over the past 3 days.  Pain is constant, throbbing and started draining last night.  No fever or chills.  Pain is currently moderate severity.  No nausea, vomiting or diarrhea.  Patient complains of penile rash which is painful.  No urethral discharge.  Patient has pain around the head when he urinates.  He denies any dysuria.  No flank pain.  No urgency or frequency.   HPI  Past Medical History:  Diagnosis Date  . Diabetes mellitus without complication ([place])   . Gout   . Hypertension     Patient Active Problem List   Diagnosis Date Noted  . Diabetes mellitus ([place]) 10/07/2019  . Hypertension 10/07/2019  . Gout 10/07/2019  . Cigarette smoker 10/07/2019  . 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 History of hip replacement, total, bilateral 10/07/2019  . Obesity 10/07/2019  . Intertrigo 10/07/2019    Past Surgical History:  Procedure Laterality Date  . TOTAL HIP ARTHROPLASTY Bilateral        Home Medications    Prior to Admission medications   Medication Sig Start Date End Date Taking? Authorizing Provider  blood glucose meter kit and supplies KIT Dispense based on patient and insurance preference. Use up to four times daily as directed. (FOR ICD-9 250.00, 250.01). 08/25/19   Robyn Haber, MD  brompheniramine-pseudoephedrine-DM 30-2-10 MG/5ML syrup Take 5 mLs by mouth 4 (four) times daily as needed. 01/07/20   Vanessa Kick, MD  clotrimazole-betamethasone (LOTRISONE) cream Apply to affected area 2 times daily prn 01/27/20   Destyn Schuyler, Myrene Galas, MD  diclofenac (VOLTAREN) 75 MG EC tablet Take 1 tablet (75 mg total) by mouth 2 (two)  times daily. 01/07/20   Vanessa Kick, MD  doxycycline (VIBRA-TABS) 100 MG tablet Take 1 tablet (100 mg total) by mouth 2 (two) times daily for 7 days. 01/27/20 02/03/20  Chase Picket, MD  fluconazole (DIFLUCAN) 100 MG tablet Take 1 tablet (100 mg total) by mouth daily. 10/07/19   Michel Bickers, MD  glipiZIDE (GLUCOTROL) 5 MG tablet Take 5 mg by mouth daily before breakfast.    [provider]  HYDROcodone-acetaminophen (NORCO/VICODIN) 5-325 MG tablet Take 1 tablet by mouth every 6 (six) hours as needed for moderate pain or severe pain. 01/07/20   Vanessa Kick, MD  ibuprofen (ADVIL) 800 MG tablet Take 1 tablet (800 mg total) by mouth 3 (three) times daily. 01/27/20   Chase Picket, MD  insulin glargine (LANTUS) 100 UNIT/ML injection Inject 70 Units into the skin at bedtime.    [provider]  losartan-hydrochlorothiazide (HYZAAR) 100-12.5 MG tablet Take 1 tablet by mouth daily.    [provider]  Miconazole Nitrate 2 % AERP Apply to the affected area twice daily 01/27/20   Mikaia Janvier, Myrene Galas, MD    Family History Family History  Problem Relation Age of Onset  . Diabetes Father   . Kidney failure Father   . Heart failure Mother     Social History Social History   Tobacco Use  . Smoking status: Current Every Day Smoker    Packs/day: 0.50  Years: 5.00    Pack years: 2.50    Types: Cigarettes  . Smokeless tobacco: Never Used  Substance Use Topics  . Alcohol use: No  . Drug use: No     Allergies   Tramadol   Review of Systems Review of Systems  Constitutional: Negative.   Respiratory: Negative.   Gastrointestinal: Negative.   Genitourinary: Positive for dysuria and genital sores. Negative for discharge, penile pain and urgency.  Musculoskeletal: Negative.   Skin: Positive for color change and wound.     Physical Exam Triage Vital Signs ED Triage Vitals  Enc Vitals Group     BP 01/27/20 1556 (!) 147/84     Pulse Rate 01/27/20 1556 98      Resp 01/27/20 1556 (!) 21     Temp 01/27/20 1556 98.4 F (36.9 C)     Temp Source 01/27/20 1556 Oral     SpO2 01/27/20 1556 98 %     Weight --      Height --      Head Circumference --      Peak Flow --      Pain Score 01/27/20 1553 9     Pain Loc --      Pain Edu? --      Excl. in [place]? --    No data found.  Updated Vital Signs BP (!) 147/84 (BP Location: Right Arm)   Pulse 98   Temp 98.4 F (36.9 C) (Oral)   Resp (!) 21   SpO2 98%   Visual Acuity Right Eye Distance:   Left Eye Distance:   Bilateral Distance:    Right Eye Near:   Left Eye Near:    Bilateral Near:     Physical Exam Vitals and nursing note reviewed.  Constitutional:      General: He is not in acute distress.    Appearance: He is not ill-appearing.  Cardiovascular:     Rate and Rhythm: Normal rate and regular rhythm.  Genitourinary:    Comments: Rash over the head of the penis.  No ulcerations. Musculoskeletal:        General: No tenderness. Normal range of motion.  Skin:    Capillary Refill: Capillary refill takes less than 2 seconds.     Comments: Right axilla and left inguinal-painful swelling.  Abscesses identified.  Appears to have drained.  No residual abscess.  Neurological:     Mental Status: He is alert.      UC Treatments / Results  Labs (all labs ordered are listed, but only abnormal results are displayed) Labs Reviewed - No data to display  EKG   Radiology No results found.  Procedures Procedures (including critical care time)  Medications Ordered in UC Medications  ketorolac (TORADOL) injection 60 mg (60 mg Intramuscular Given 01/27/20 1650)    Initial Impression / Assessment and Plan / UC Course  I have reviewed the triage vital signs and the nursing notes.  Pertinent labs & imaging results that were available during my care of the patient were reviewed by me and considered in my medical decision making (see chart for details).     1.  Multiple abscesses  involving the right axilla and the left groin: Abscesses have drained spontaneously No residual abscess Doxycycline 100 mg twice daily for 7 days If there is recollection of abscess patient is advised to return to the urgent care to be reevaluated  2.  Balanitis circinata: Miconazole powder applied to the groin and around the  head of the penis twice daily Lotrisone cream-apply twice daily Ibuprofen 800 mg every 8 hours as needed for pain. Return precautions given  Final Clinical Impressions(s) / UC Diagnoses   Final diagnoses:  Abscess  Balanitis circinata     Discharge Instructions     If you notice worsening swelling, drainage, fever, chills, pain-please return to the urgent care to be reevaluated.   ED Prescriptions    Medication Sig Dispense Auth. Provider   doxycycline (VIBRA-TABS) 100 MG tablet Take 1 tablet (100 mg total) by mouth 2 (two) times daily for 7 days. 14 tablet Johngabriel Verde, Myrene Galas, MD   Miconazole Nitrate 2 % AERP Apply to the affected area twice daily 130 g Dorothea Yow, Myrene Galas, MD   clotrimazole-betamethasone (LOTRISONE) cream Apply to affected area 2 times daily prn 15 g Shareena Nusz, Myrene Galas, MD   ibuprofen (ADVIL) 800 MG tablet Take 1 tablet (800 mg total) by mouth 3 (three) times daily. 21 tablet Chirag Krueger, Myrene Galas, MD     PDMP not reviewed this encounter.   Chase Picket, MD 01/27/20 [phone]

## 2020-02-23 ENCOUNTER — Encounter (HOSPITAL_COMMUNITY): Payer: Self-pay | Admitting: Emergency Medicine

## 2020-02-23 ENCOUNTER — Emergency Department (HOSPITAL_COMMUNITY)
Admission: EM | Admit: 2020-02-23 | Discharge: 2020-02-24 | Disposition: A | Discharge status: Home / Self Care | Payer: Medicare HMO | Attending: Emergency Medicine | Admitting: Emergency Medicine

## 2020-02-23 ENCOUNTER — Ambulatory Visit (HOSPITAL_COMMUNITY)
Admission: EM | Admit: 2020-02-23 | Discharge: 2020-02-23 | Disposition: A | Discharge status: Home / Self Care | Payer: Medicare HMO

## 2020-02-23 ENCOUNTER — Other Ambulatory Visit: Payer: Self-pay

## 2020-02-23 DIAGNOSIS — Z5321 Procedure and treatment not carried out due to patient leaving prior to being seen by health care provider: Secondary | ICD-10-CM | POA: Insufficient documentation

## 2020-02-23 DIAGNOSIS — R739 Hyperglycemia, unspecified: Secondary | ICD-10-CM | POA: Insufficient documentation

## 2020-02-23 DIAGNOSIS — B379 Candidiasis, unspecified: Secondary | ICD-10-CM | POA: Diagnosis not present

## 2020-02-23 DIAGNOSIS — Z794 Long term (current) use of insulin: Secondary | ICD-10-CM | POA: Insufficient documentation

## 2020-02-23 LAB — URINALYSIS, ROUTINE W REFLEX MICROSCOPIC
Bacteria, UA: NONE SEEN
Bilirubin Urine: NEGATIVE
Glucose, UA: >=500 mg/dL — AB
Hgb urine dipstick: NEGATIVE
Ketones, ur: NEGATIVE mg/dL
Leukocytes,Ua: NEGATIVE
Nitrite: NEGATIVE
Protein, ur: NEGATIVE mg/dL
Specific Gravity, Urine: 1.024 (ref 1.005–1.030)
pH: 6 (ref 5.0–8.0)

## 2020-02-23 LAB — BASIC METABOLIC PANEL
Anion gap: 10 (ref 5–15)
BUN: 13 mg/dL (ref 6–20)
CO2: 26 mmol/L (ref 22–32)
Calcium: 8.8 mg/dL — ABNORMAL LOW (ref 8.9–10.3)
Chloride: 95 mmol/L — ABNORMAL LOW (ref 98–111)
Creatinine, Ser: 0.94 mg/dL (ref 0.61–1.24)
GFR calc Af Amer: >60 mL/min (ref >60)
GFR calc non Af Amer: >60 mL/min (ref >60)
Glucose, Bld: 570 mg/dL (ref 70–99)
Potassium: 4.1 mmol/L (ref 3.5–5.1)
Sodium: 131 mmol/L — ABNORMAL LOW (ref 135–145)

## 2020-02-23 LAB — CBC
HCT: 44.6 % (ref 39.0–52.0)
Hemoglobin: 14.8 g/dL (ref 13.0–17.0)
MCH: 26.5 pg (ref 26.0–34.0)
MCHC: 33.2 g/dL (ref 30.0–36.0)
MCV: 79.9 fL — ABNORMAL LOW (ref 80.0–100.0)
Platelets: 243 10*3/uL (ref 150–400)
RBC: 5.58 MIL/uL (ref 4.22–5.81)
RDW: 14 % (ref 11.5–15.5)
WBC: 7.3 10*3/uL (ref 4.0–10.5)
nRBC: 0 % (ref 0.0–0.2)

## 2020-02-23 LAB — CBG MONITORING, ED: Glucose-Capillary: 590 mg/dL (ref 70–99)

## 2020-02-23 NOTE — ED Notes (Signed)
Informed Jerrilyn Cairo NP of Glucose 590 mg/dL, Kim advised patient to go to ED for further evaluation.

## 2020-02-23 NOTE — ED Triage Notes (Signed)
Patient from urgent care, CBG was 590 there.  Patient also has a yeast infection in his groin that is quite painful.  States that he has the infection for the last month.  Patient takes his insulin and oral meds but CBG is usually lower.

## 2020-02-24 DIAGNOSIS — R739 Hyperglycemia, unspecified: Secondary | ICD-10-CM | POA: Diagnosis not present

## 2020-02-24 LAB — CBG MONITORING, ED
Glucose-Capillary: 419 mg/dL — ABNORMAL HIGH (ref 70–99)
Glucose-Capillary: 381 mg/dL — ABNORMAL HIGH (ref 70–99)

## 2020-02-24 NOTE — ED Notes (Signed)
Called patient for vitals patient didn't answer will try again later

## 2020-02-25 ENCOUNTER — Ambulatory Visit (HOSPITAL_COMMUNITY)
Admission: EM | Admit: 2020-02-25 | Discharge: 2020-02-25 | Disposition: A | Discharge status: Home / Self Care | Payer: Medicare HMO | Attending: Family Medicine | Admitting: Family Medicine

## 2020-02-25 ENCOUNTER — Encounter (HOSPITAL_COMMUNITY): Payer: Self-pay

## 2020-02-25 ENCOUNTER — Ambulatory Visit (HOSPITAL_COMMUNITY)
Admission: EM | Admit: 2020-02-25 | Discharge: 2020-02-25 | Discharge status: Against Medical Advice | Payer: Medicare HMO | Attending: Family Medicine | Admitting: Family Medicine

## 2020-02-25 ENCOUNTER — Other Ambulatory Visit: Payer: Self-pay

## 2020-02-25 DIAGNOSIS — B356 Tinea cruris: Secondary | ICD-10-CM | POA: Diagnosis not present

## 2020-02-25 DIAGNOSIS — I10 Essential (primary) hypertension: Secondary | ICD-10-CM

## 2020-02-25 DIAGNOSIS — E1165 Type 2 diabetes mellitus with hyperglycemia: Secondary | ICD-10-CM

## 2020-02-25 MED ORDER — HYDROCODONE-ACETAMINOPHEN 5-325 MG PO TABS: 1.0000 | ORAL_TABLET | Freq: Four times a day (QID) | ORAL | 0 refills | Status: AC | PRN

## 2020-02-25 MED ORDER — FLUCONAZOLE 100 MG PO TABS: ORAL_TABLET | ORAL | 0 refills | Status: AC

## 2020-02-25 MED ORDER — INSULIN GLARGINE 100 UNIT/ML ~~LOC~~ SOLN: 70.0000 [IU] | Freq: Every day | SUBCUTANEOUS | 2 refills | Status: AC

## 2020-02-25 MED ORDER — INSULIN PEN NEEDLE 29G X 10MM MISC: 2 refills | Status: AC

## 2020-02-25 MED ORDER — LOSARTAN POTASSIUM-HCTZ 100-12.5 MG PO TABS: 1.0000 | ORAL_TABLET | Freq: Every day | ORAL | 2 refills | Status: AC

## 2020-02-25 NOTE — ED Notes (Signed)
Pt called by Charna Elizabeth for room assignment-no answer. Pt called by M. Fredric Mare RN for room assignment 3 minutes later- no answer.

## 2020-02-25 NOTE — ED Triage Notes (Signed)
Pt reports having yeast in the groin area x 1 month. States having pain and itchiness in the groin area. Monistat cream gives no relief.

## 2020-03-01 NOTE — ED Provider Notes (Signed)
[facility]   [id number] 02/25/20 Arrival Time: 1127  ASSESSMENT & PLAN:  1. Tinea cruris   2. Uncontrolled hypertension   3. Uncontrolled type 2 diabetes mellitus with hyperglycemia (HCC)     Meds ordered this encounter  Medications   fluconazole (DIFLUCAN) 100 MG tablet    Sig: Take 2 tablets on day #1 then 1 tablet daily on days #2-10.    Dispense:  11 tablet    Refill:  0   HYDROcodone-acetaminophen (NORCO/VICODIN) 5-325 MG tablet    Sig: Take 1 tablet by mouth every 6 (six) hours as needed for moderate pain or severe pain.    Dispense:  8 tablet    Refill:  0   insulin glargine (LANTUS) 100 UNIT/ML injection    Sig: Inject 0.7 mLs (70 Units total) into the skin at bedtime.    Dispense:  10 mL    Refill:  2   losartan-hydrochlorothiazide (HYZAAR) 100-12.5 MG tablet    Sig: Take 1 tablet by mouth daily.    Dispense:  30 tablet    Refill:  2   Insulin Pen Needle 29G X MISC    Sig: Use as directed with Lantus pen.    Dispense:  1 each    Refill:  2    HTN and DM medications refilled at his request.  Will follow up with PCP or here if worsening or failing to improve as anticipated. Reviewed expectations re: course of current medical issues. Questions answered. Outlined signs and symptoms indicating need for more acute intervention. Patient verbalized understanding. After Visit Summary given.   SUBJECTIVE:  Kevin Mccarty is a 29 y.o. male who presents with a skin complaint. Recurrent rash and itchiness in groin. Current symptoms over past few weeks. OTC Monistat without relief. BS have been running high lately as well as BP. Needs refills. Afebrile. Normal urination reported.    OBJECTIVE: Vitals:   02/25/20 1323  BP: (!) 152/108  Pulse: 74  Resp: 20  Temp: 98.1 F (36.7 C)  TempSrc: Oral  SpO2: 100%    General appearance: alert; no distress HEENT: [place]; AT Neck: supple with FROM Lungs: clear to auscultation bilaterally Heart: regular  rate and rhythm Abd: obese Extremities: no edema; moves all extremities normally Skin: warm and dry; significant tinea cruris; no sign of bacterial infection Psychological: alert and cooperative; normal mood and affect  Allergies  Allergen Reactions   Tramadol Nausea And Vomiting    Past Medical History:  Diagnosis Date   Diabetes mellitus without complication (HCC)    Gout    Hypertension    Social History   Socioeconomic History   Marital status: Single    Spouse name: Not on file   Number of children: Not on file   Years of education: Not on file   Highest education level: Not on file  Occupational History   Not on file  Tobacco Use   Smoking status: Current Every Day Smoker    Packs/day: 0.50    Years: 5.00    Pack years: 2.50    Types: Cigarettes   Smokeless tobacco: Never Used  Substance and Sexual Activity   Alcohol use: No   Drug use: No   Sexual activity: Not Currently  Other Topics Concern   Not on file  Social History Narrative   Not on file   Social Determinants of Health   Financial Resource Strain:    Difficulty of Paying Living Expenses: Not on file  Food Insecurity:  Worried About Programme researcher, broadcasting/film/video in the Last Year: Not on file   [employer] of Food in the Last Year: Not on file  Transportation Needs:    Lack of Transportation (Medical): Not on file   Lack of Transportation (Non-Medical): Not on file  Physical Activity:    Days of Exercise per Week: Not on file   Minutes of Exercise per Session: Not on file  Stress:    Feeling of Stress : Not on file  Social Connections:    Frequency of Communication with Friends and Family: Not on file   Frequency of Social Gatherings with Friends and Family: Not on file   Attends Religious Services: Not on file   Active Member of Clubs or Organizations: Not on file   Attends Banker Meetings: Not on file   Marital Status: Not on file  Intimate Partner  Violence:    Fear of Current or Ex-Partner: Not on file   Emotionally Abused: Not on file   Physically Abused: Not on file   Sexually Abused: Not on file   Family History  Problem Relation Age of Onset   Diabetes Father    Kidney failure Father    Heart failure Mother    Past Surgical History:  Procedure Laterality Date   TOTAL HIP ARTHROPLASTY Bilateral      Mardella Layman, MD 03/01/20 [id number]

## 2021-08-21 IMAGING — CR DG CHEST 2V
2 series · 2 of 2 positions shown · non-contrast
Comparison: None.

CLINICAL DATA: 28-year-old male with chest pain.  Fall yesterday.

EXAM:
CHEST - 2 VIEW

[chest pa]
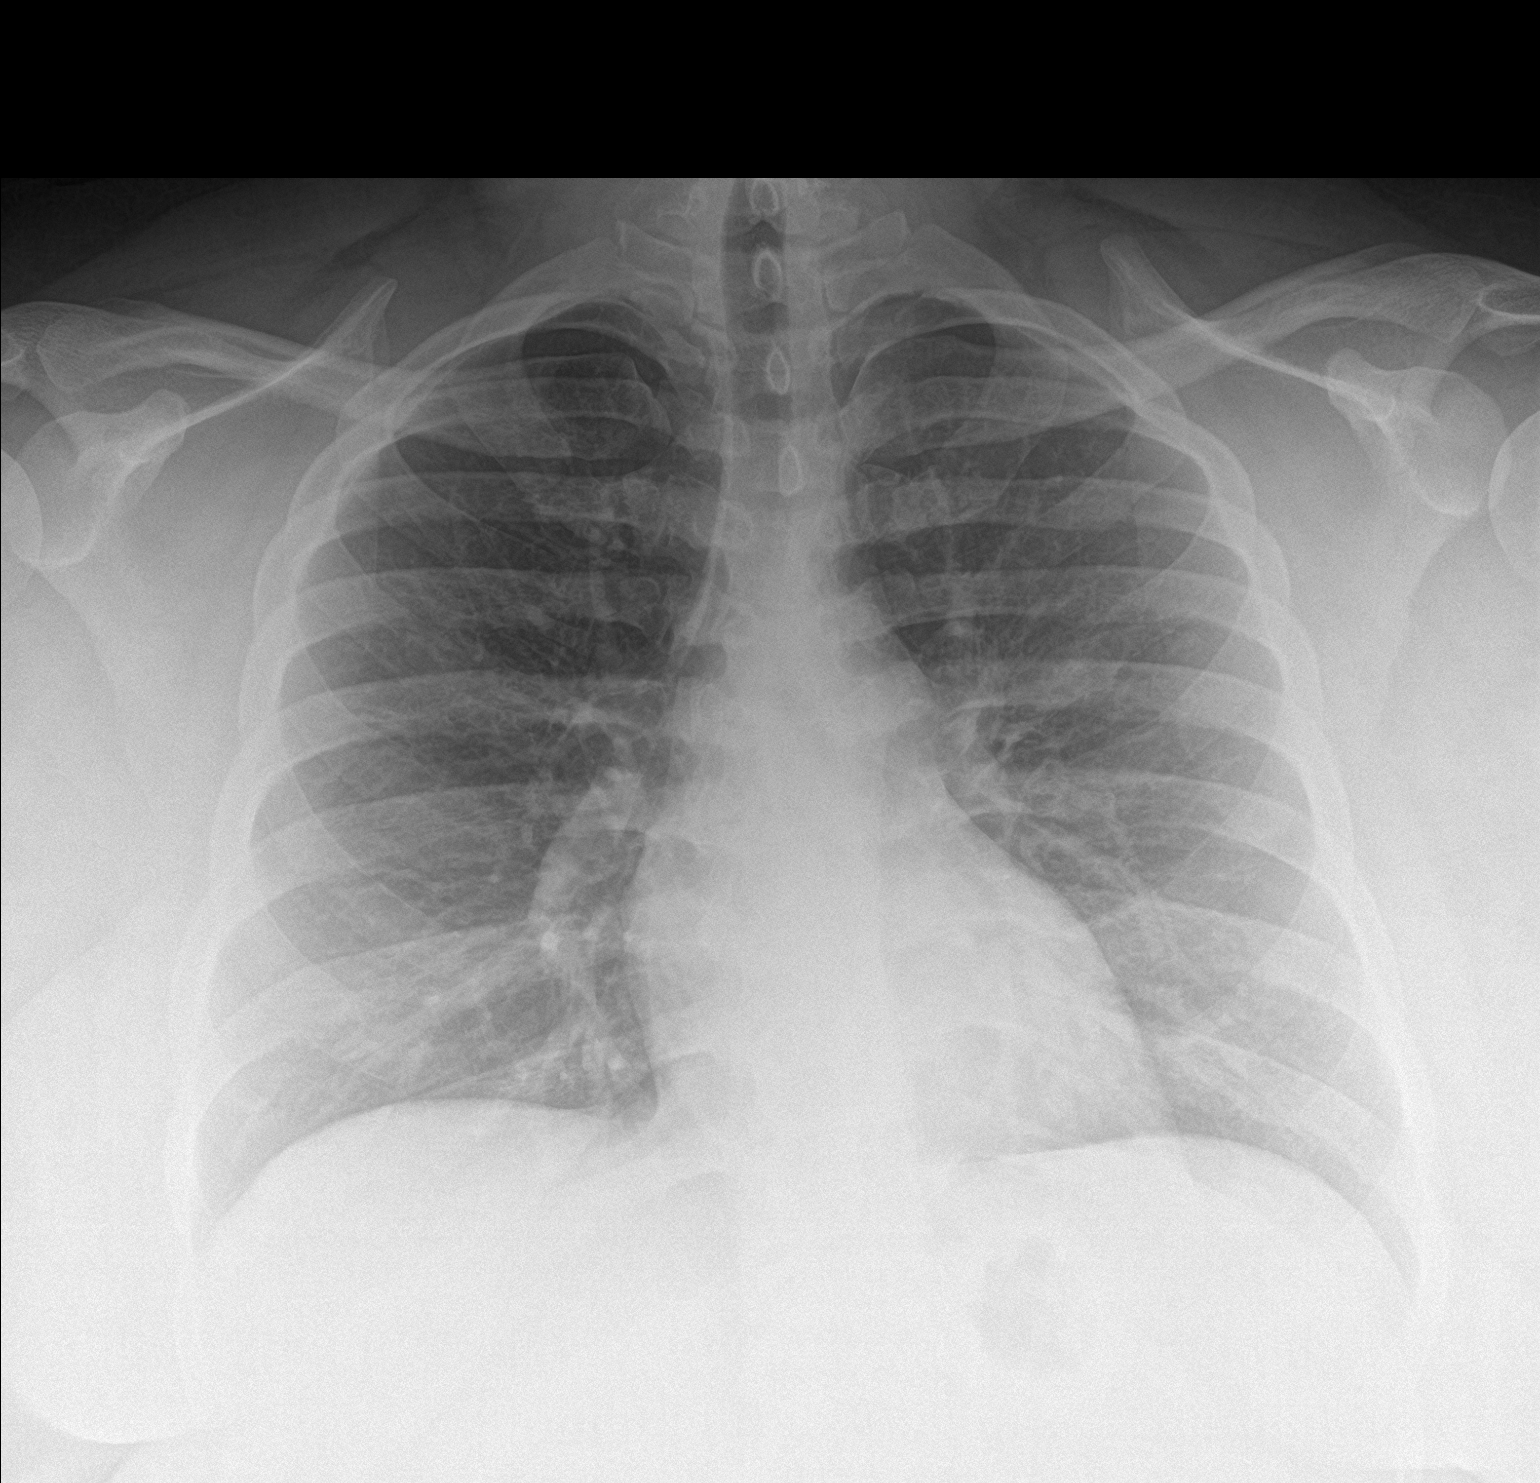

[chest lat]
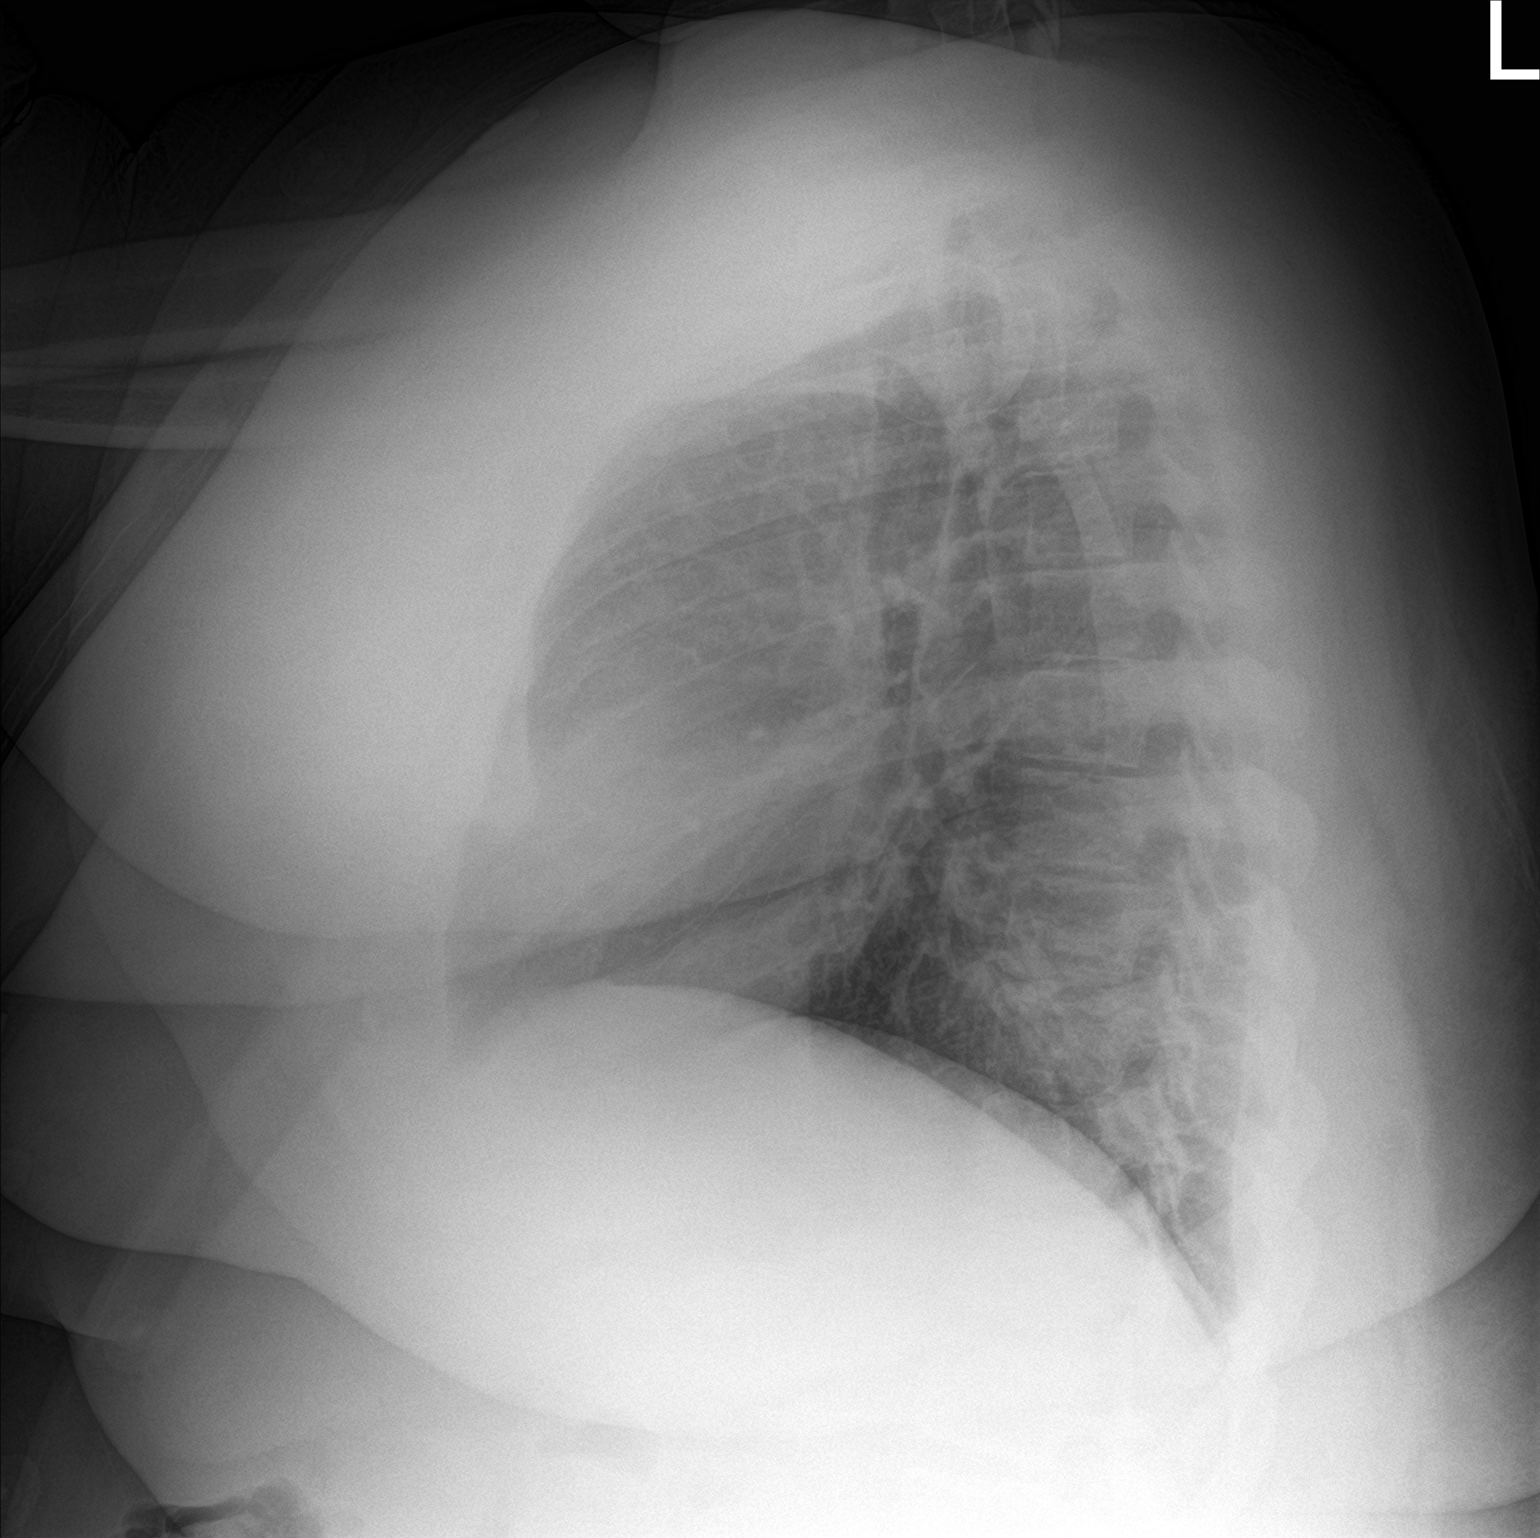

[2 of 2 positions shown; findings below may reference images not displayed]

FINDINGS: Lung volumes and mediastinal contours are within normal limits.
Visualized tracheal air column is within normal limits. Both lungs
appear clear. No pneumothorax or pleural effusion. No osseous
abnormality identified. Negative visible bowel gas pattern.
IMPRESSION: Negative.  No acute cardiopulmonary abnormality.

## 2021-08-21 IMAGING — CR DG KNEE COMPLETE 4+V*R*
4 series · 4 of 4 positions shown · non-contrast
Comparison: None.

CLINICAL DATA: Fall and pain

EXAM:
RIGHT KNEE - COMPLETE 4+ VIEW

[knee ap]
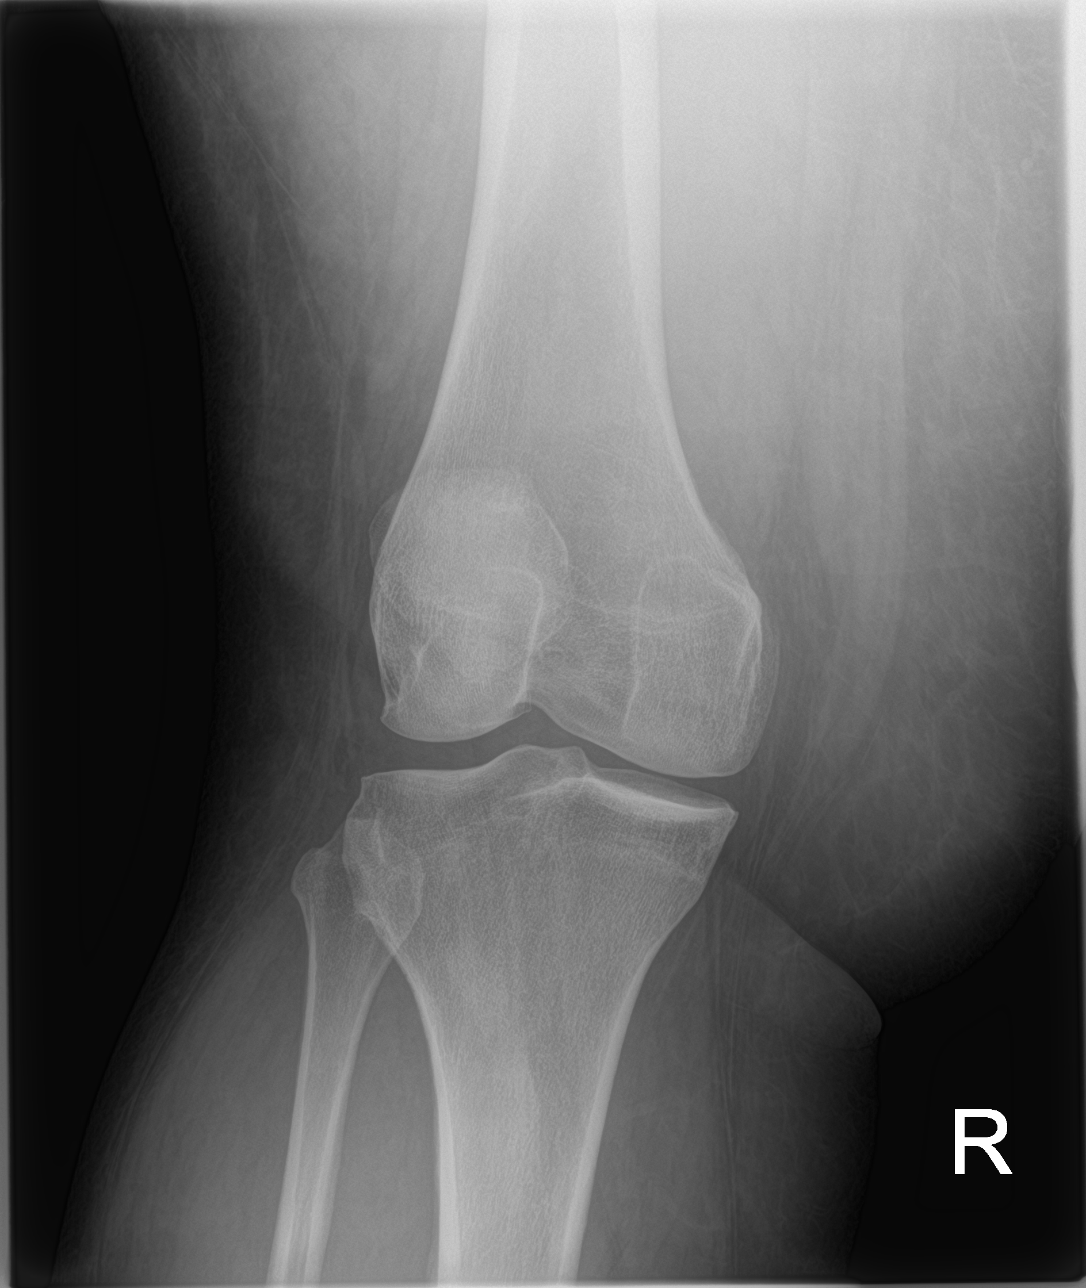

[knee lat]
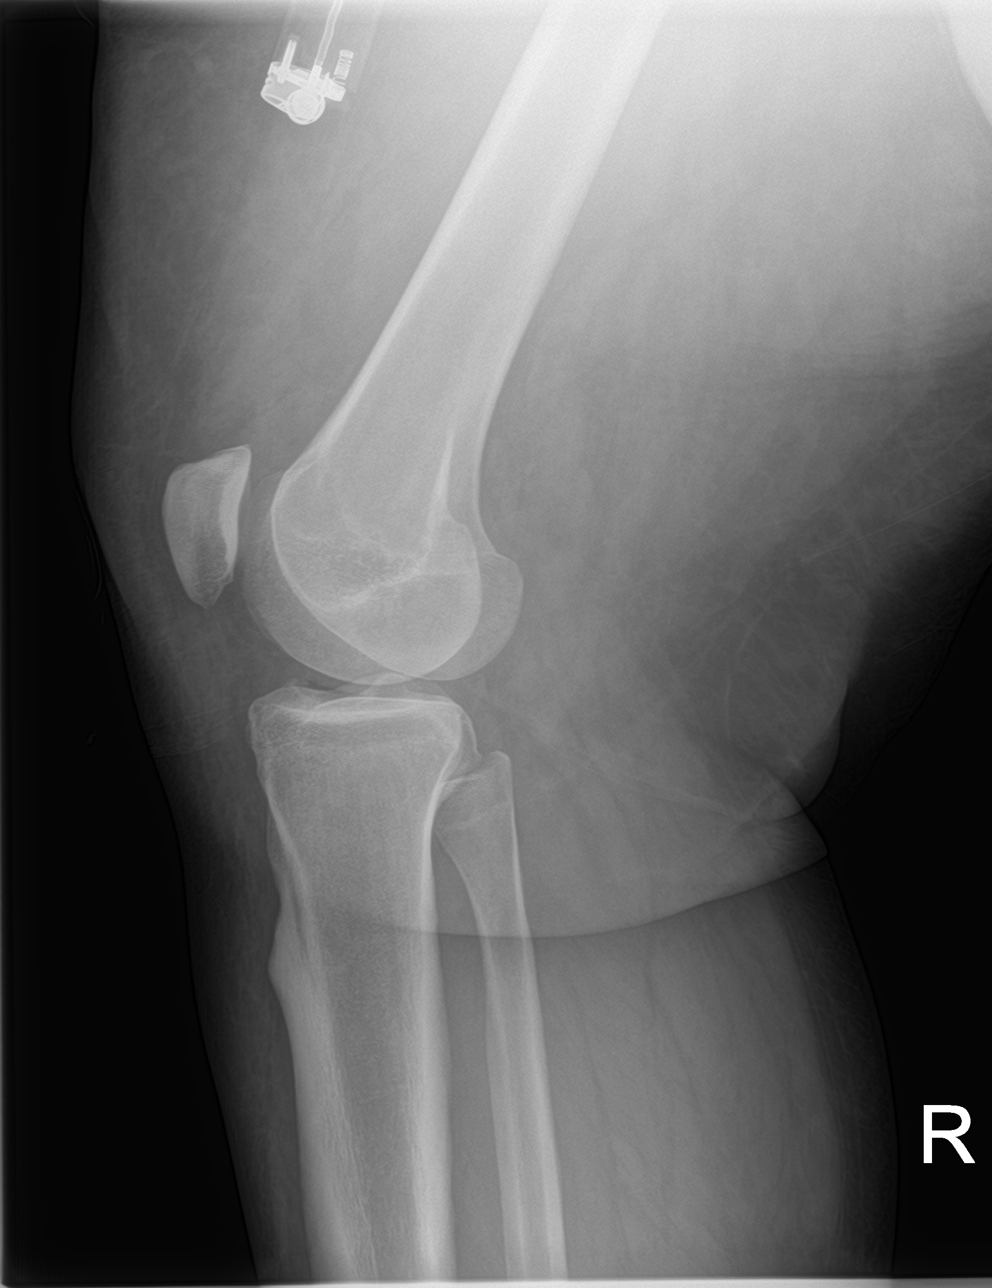

[knee obl (1 of 2)]
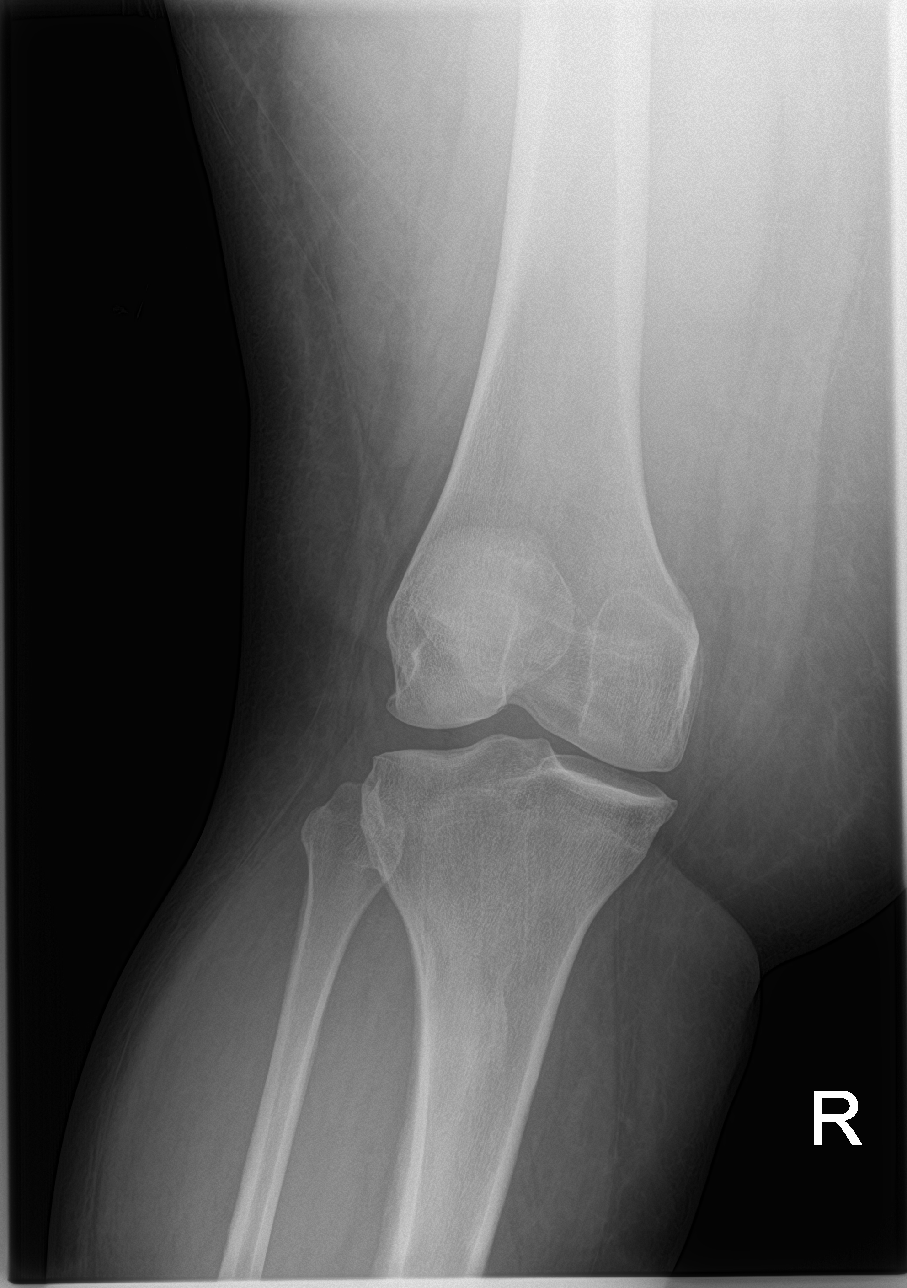

[knee obl (2 of 2)]
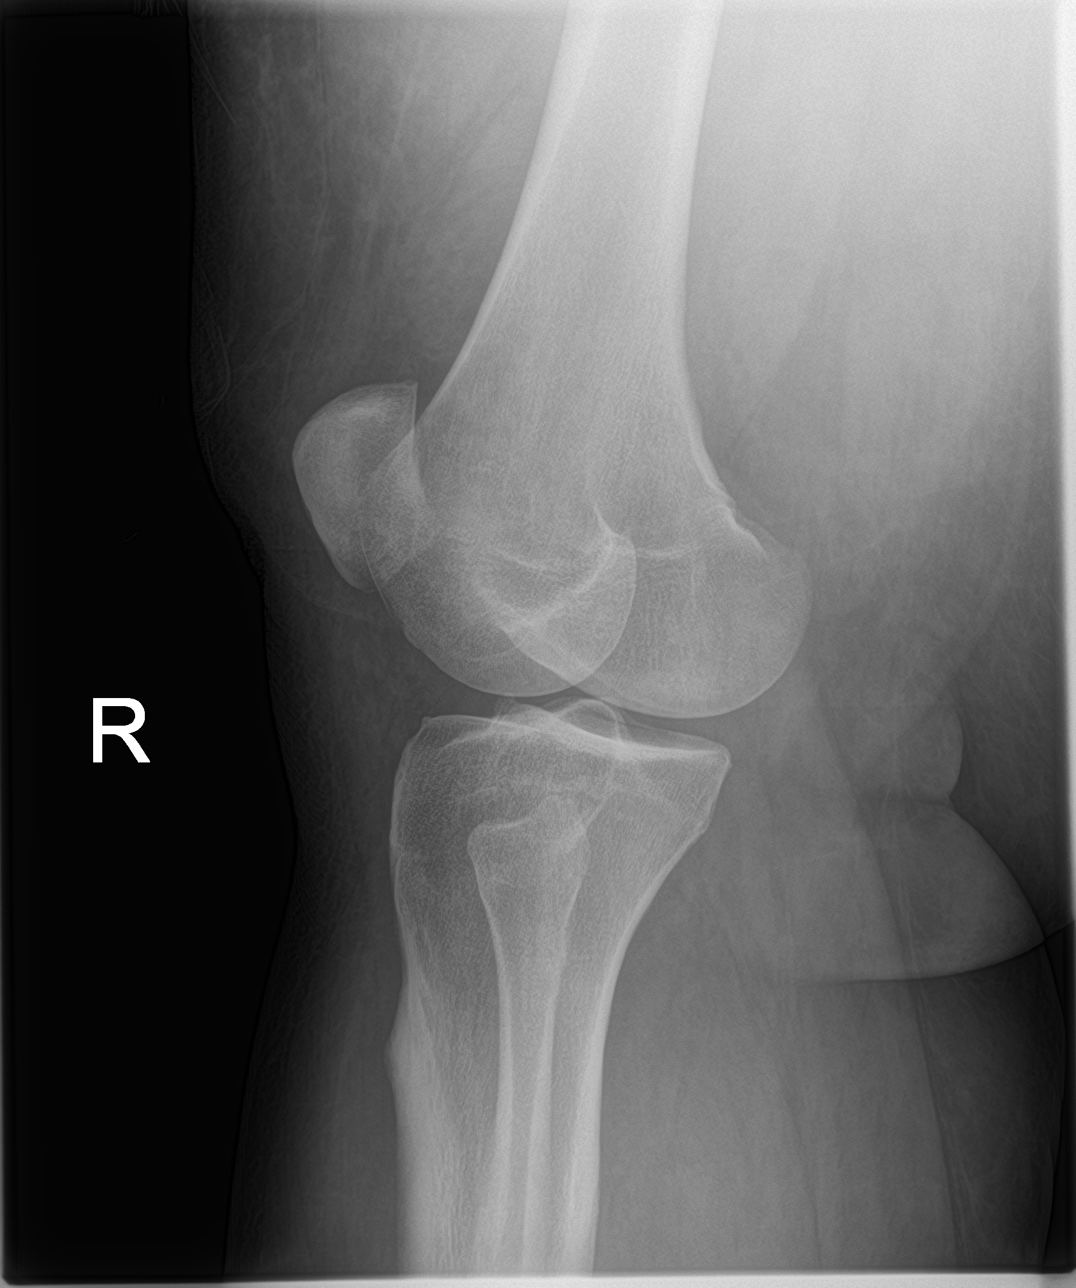

[4 of 4 positions shown; findings below may reference images not displayed]

FINDINGS: No evidence of fracture, dislocation, or joint effusion. No evidence
of arthropathy or other focal bone abnormality. Soft tissues are
unremarkable.
IMPRESSION: Negative.
